# Patient Record
Sex: Female | Born: 1960 | Race: White | Hispanic: No | Smoking: Never smoker
Health system: Southern US, Community
[De-identification: ages and names within clinical notes are randomized; demographics above are authoritative.]

## PROBLEM LIST (undated history)

## (undated) DIAGNOSIS — K219 Gastro-esophageal reflux disease without esophagitis: Secondary | ICD-10-CM

## (undated) DIAGNOSIS — M722 Plantar fascial fibromatosis: Secondary | ICD-10-CM

## (undated) DIAGNOSIS — N951 Menopausal and female climacteric states: Secondary | ICD-10-CM

## (undated) DIAGNOSIS — K644 Residual hemorrhoidal skin tags: Secondary | ICD-10-CM

## (undated) DIAGNOSIS — E7800 Pure hypercholesterolemia, unspecified: Secondary | ICD-10-CM

## (undated) DIAGNOSIS — K5090 Crohn's disease, unspecified, without complications: Secondary | ICD-10-CM

## (undated) DIAGNOSIS — I10 Essential (primary) hypertension: Secondary | ICD-10-CM

## (undated) HISTORY — DX: Pure hypercholesterolemia, unspecified: E78.00

## (undated) HISTORY — PX: KNEE SURGERY: SHX244

## (undated) HISTORY — DX: Residual hemorrhoidal skin tags: K64.4

## (undated) HISTORY — DX: Menopausal and female climacteric states: N95.1

## (undated) HISTORY — PX: COLON SURGERY: SHX602

## (undated) HISTORY — DX: Plantar fascial fibromatosis: M72.2

---

## 2004-09-22 ENCOUNTER — Ambulatory Visit: Payer: Self-pay

## 2005-12-31 ENCOUNTER — Ambulatory Visit: Payer: Self-pay

## 2007-01-01 ENCOUNTER — Ambulatory Visit: Payer: Self-pay

## 2008-01-05 ENCOUNTER — Ambulatory Visit: Payer: Self-pay

## 2009-02-07 ENCOUNTER — Ambulatory Visit: Payer: Self-pay

## 2010-07-25 ENCOUNTER — Ambulatory Visit: Payer: Self-pay | Admitting: Urology

## 2010-08-09 ENCOUNTER — Ambulatory Visit: Payer: Self-pay | Admitting: Urology

## 2010-08-16 ENCOUNTER — Ambulatory Visit: Payer: Self-pay | Admitting: Urology

## 2010-09-19 ENCOUNTER — Ambulatory Visit: Payer: Self-pay

## 2011-12-24 ENCOUNTER — Ambulatory Visit: Payer: Self-pay

## 2012-01-02 ENCOUNTER — Ambulatory Visit: Payer: Self-pay

## 2013-12-03 HISTORY — PX: COLONOSCOPY: SHX174

## 2014-01-22 ENCOUNTER — Emergency Department: Payer: Self-pay | Admitting: Emergency Medicine

## 2014-01-23 LAB — URINALYSIS, COMPLETE
BILIRUBIN, UR: NEGATIVE
Glucose,UR: NEGATIVE mg/dL (ref 0–75)
Ketone: NEGATIVE
Nitrite: NEGATIVE
PH: 5 (ref 4.5–8.0)
Protein: NEGATIVE
RBC,UR: 18 /HPF (ref 0–5)
Specific Gravity: 1.029 (ref 1.003–1.030)
Squamous Epithelial: 1
WBC UR: 19 /HPF (ref 0–5)

## 2014-07-12 ENCOUNTER — Ambulatory Visit: Payer: Self-pay | Admitting: Gastroenterology

## 2017-02-14 ENCOUNTER — Encounter: Payer: Self-pay | Admitting: Certified Nurse Midwife

## 2017-02-14 ENCOUNTER — Ambulatory Visit (INDEPENDENT_AMBULATORY_CARE_PROVIDER_SITE_OTHER): Payer: BLUE CROSS/BLUE SHIELD | Admitting: Certified Nurse Midwife

## 2017-02-14 VITALS — BP 134/84 | HR 77 | Ht 64.0 in | Wt 176.0 lb

## 2017-02-14 DIAGNOSIS — N951 Menopausal and female climacteric states: Secondary | ICD-10-CM

## 2017-02-14 DIAGNOSIS — Z124 Encounter for screening for malignant neoplasm of cervix: Secondary | ICD-10-CM

## 2017-02-14 DIAGNOSIS — Z01419 Encounter for gynecological examination (general) (routine) without abnormal findings: Secondary | ICD-10-CM

## 2017-02-14 DIAGNOSIS — E7800 Pure hypercholesterolemia, unspecified: Secondary | ICD-10-CM | POA: Insufficient documentation

## 2017-02-14 MED ORDER — ESTRADIOL 1 MG PO TABS: 1.0000 mg | ORAL_TABLET | Freq: Every day | ORAL | 11 refills | Status: DC

## 2017-02-14 MED ORDER — NORETHINDRONE ACETATE 5 MG PO TABS: 2.5000 mg | ORAL_TABLET | Freq: Every day | ORAL | 11 refills | Status: DC

## 2017-02-14 NOTE — Progress Notes (Signed)
Gynecology Annual Exam  PCP: Maryland Pink, MD  Chief Complaint:  Chief Complaint  Patient presents with  . Gynecologic Exam  . Refill of hormone therapy  Needs refill of HT  History of Present Illness: Patient is a 56 y.o. G0 presents for annual exam. The patient has no complaints today. Her vasomotor symptoms are controlled on her hormone therapy. She has had no vaginal bleeding.  Since her last annual 02/09/2017, she has had no significant changes in her health. She has gained 12# since she sold her . Her husband has had a recurrence of his colon cancer. He had surgery for a bowel blockage and a PET scah shows that the cancer has metastasized to his liver and lung. He is receiving chemotherapy and she is his caregiver.    The patient is sexually active. She has no complaints of vaginal dryness.  last pap: on 02/10/2016 was NIL., last mammogram: 03/19/2016 was negative and she had a colonoscopy in 2015 that was negative. Her next colonoscopy is due in 2025.  The patient has regular exercise: no. She does get adequate calcium in her diet. She does not drink or use tobacco products. Review of Systems: ROS  Past Medical History:  Past Medical History:  Diagnosis Date  . Hypercholesteremia   . Vasomotor symptoms due to menopause     Past Surgical History:  Past Surgical History:  Procedure Laterality Date  . COLONOSCOPY  2015   normal, was advised to return in 10 years    Gynecologic History:  No history of abnormal Pap smears  Obstetric History: G0P0000  Family History:  Family History  Problem Relation Age of Onset  . Diabetes Mother   . Hypertension Mother   . Hyperlipidemia Mother   . Prostate cancer Mother   . Hypertension Father   . Hyperlipidemia Father   . Dementia Father     Social History:  Social History   Social History  . Marital status: Married    Spouse name: N/A  . Number of children: N/A  . Years of education: N/A    Occupational History  . Not on file.   Social History Main Topics  . Smoking status: Never Smoker  . Smokeless tobacco: Never Used  . Alcohol use No  . Drug use: No  . Sexual activity: Yes    Birth control/ protection: None   Other Topics Concern  . Not on file   Social History Narrative  . No narrative on file    Allergies:  No Known Allergies  Medications: Prior to Admission medications   Medication Sig Start Date End Date Taking? Authorizing Provider  Ascorbic Acid (VITAMIN C) 1000 MG tablet Take by mouth.   Yes Historical Provider, MD  Cholecalciferol (VITAMIN D3) 1000 units CAPS Take by mouth.   Yes Historical Provider, MD  Cyanocobalamin (VITAMIN B-12) 2500 MCG SUBL Take by mouth.   Yes Historical Provider, MD  estradiol (ESTRACE) 1 MG tablet Take 1 mg by mouth daily. 01/24/17  Yes Historical Provider, MD  norethindrone (AYGESTIN) 5 MG tablet Take 2.5 mg by mouth daily. 01/08/17  Yes Historical Provider, MD  simvastatin (ZOCOR) 20 MG tablet Take by mouth. 01/23/17  Yes Historical Provider, MD    Physical Exam Vitals: Blood pressure 134/84, pulse 77, height 5\' 4"  (1.626 m), weight 176 lb (79.8 kg).  General: NAD HEENT: normocephalic, anicteric Thyroid: no enlargement, no palpable nodules Pulmonary: No increased work of breathing, CTAB Cardiovascular: RRR without  murmur Breast: Breast symmetrical, no tenderness, no palpable nodules or masses, no skin or nipple retraction present, no nipple discharge.  No axillary or supraclavicular lymphadenopathy. Abdomen soft, non-tender, non-distended.  Umbilicus without lesions.  No hepatomegaly. No masses palpable. No evidence of hernia  Genitourinary:  External: Normal external female genitalia.  Normal urethral meatus, normal  Bartholin's and Skene's glands.    Vagina: Normal vaginal mucosa, no evidence of prolapse.    Cervix: stenotic, no bleeding  Uterus:AV, NSSC, mobile, NT  Adnexa:  no adnexal masses, NT  Rectal:  deferred  Lymphatic: no evidence of inguinal lymphadenopathy Extremities: no edema, erythema, or tenderness Neurologic: Grossly intact Psychiatric: mood appropriate, affect full         Assessment: 56 yo menopausal female with normal well woman exam. Plan: Problem List Items Addressed This Visit    Vasomotor symptoms due to menopause   Relevant Medications   estradiol (ESTRACE) 1 MG tablet   norethindrone (AYGESTIN) 5 MG tablet    Other Visit Diagnoses    Encounter for gynecological examination (general) (routine) without abnormal findings    -  Primary   Relevant Orders   IGP, Aptima HPV   Screening for cervical cancer       Relevant Orders   IGP, Aptima HPV      1) Mammogram  - recommend yearly screening mammogram - patient to schedule screening mammogram at [facility]   2) Pap smear - ASCCP guidelines and rational discussed.  Patient opts for every 3 year screening interval - Pap done today  3) Osteoporosis prevention-HT and adequate calcium and vitamin D intake  4) Routine healthcare maintenance including cholesterol, diabetes screening  Per her PCP Dr Kary Kos  5) Colonoscopy -UTD  6) Follow up 1 year for routine annual  Dalia Heading, [place]

## 2017-02-16 LAB — IGP, APTIMA HPV
HPV Aptima: NEGATIVE
PAP Smear Comment: 0

## 2017-02-19 ENCOUNTER — Other Ambulatory Visit: Payer: Self-pay | Admitting: Certified Nurse Midwife

## 2017-02-19 DIAGNOSIS — Z1231 Encounter for screening mammogram for malignant neoplasm of breast: Secondary | ICD-10-CM

## 2017-04-19 ENCOUNTER — Ambulatory Visit
Admission: RE | Admit: 2017-04-19 | Discharge: 2017-04-19 | Disposition: A | Discharge status: Home / Self Care | Payer: BLUE CROSS/BLUE SHIELD | Source: Ambulatory Visit | Attending: Certified Nurse Midwife | Admitting: Certified Nurse Midwife

## 2017-04-19 DIAGNOSIS — Z1231 Encounter for screening mammogram for malignant neoplasm of breast: Secondary | ICD-10-CM | POA: Insufficient documentation

## 2018-03-04 ENCOUNTER — Other Ambulatory Visit: Payer: Self-pay | Admitting: Certified Nurse Midwife

## 2018-03-04 DIAGNOSIS — N951 Menopausal and female climacteric states: Secondary | ICD-10-CM

## 2018-03-06 ENCOUNTER — Telehealth: Payer: Self-pay | Admitting: Certified Nurse Midwife

## 2018-03-06 DIAGNOSIS — N951 Menopausal and female climacteric states: Secondary | ICD-10-CM

## 2018-03-06 NOTE — Telephone Encounter (Signed)
Did she say which medication she needed?

## 2018-03-06 NOTE — Telephone Encounter (Signed)
PT has ean scheduled on 4/26 and only has 2 days left on her meds. Please send in ASAP so [facility] on [address]

## 2018-03-07 ENCOUNTER — Other Ambulatory Visit: Payer: Self-pay | Admitting: Certified Nurse Midwife

## 2018-03-07 DIAGNOSIS — N951 Menopausal and female climacteric states: Secondary | ICD-10-CM

## 2018-03-07 MED ORDER — NORETHINDRONE ACETATE 5 MG PO TABS: 2.5000 mg | ORAL_TABLET | Freq: Every day | ORAL | 0 refills | Status: DC

## 2018-03-07 NOTE — Telephone Encounter (Signed)
norethindrone (AYGESTIN) 5 MG tablet

## 2018-03-07 NOTE — Telephone Encounter (Signed)
rx sent with zero refills.

## 2018-03-10 ENCOUNTER — Telehealth: Payer: Self-pay

## 2018-03-10 NOTE — Telephone Encounter (Signed)
Spoke w/Yuha verbal given of Norethindrone 5 mg #15 w/0 rf.

## 2018-03-24 ENCOUNTER — Other Ambulatory Visit: Payer: Self-pay | Admitting: Certified Nurse Midwife

## 2018-03-24 DIAGNOSIS — Z1231 Encounter for screening mammogram for malignant neoplasm of breast: Secondary | ICD-10-CM

## 2018-03-28 ENCOUNTER — Encounter: Payer: Self-pay | Admitting: Certified Nurse Midwife

## 2018-03-28 ENCOUNTER — Ambulatory Visit (INDEPENDENT_AMBULATORY_CARE_PROVIDER_SITE_OTHER): Payer: BLUE CROSS/BLUE SHIELD | Admitting: Certified Nurse Midwife

## 2018-03-28 VITALS — BP 138/78 | HR 88 | Ht 64.0 in | Wt 176.0 lb

## 2018-03-28 DIAGNOSIS — N951 Menopausal and female climacteric states: Secondary | ICD-10-CM | POA: Diagnosis not present

## 2018-03-28 DIAGNOSIS — Z0000 Encounter for general adult medical examination without abnormal findings: Secondary | ICD-10-CM | POA: Diagnosis not present

## 2018-03-28 DIAGNOSIS — Z01419 Encounter for gynecological examination (general) (routine) without abnormal findings: Secondary | ICD-10-CM

## 2018-03-28 DIAGNOSIS — Z124 Encounter for screening for malignant neoplasm of cervix: Secondary | ICD-10-CM

## 2018-03-28 MED ORDER — ESTRADIOL 1 MG PO TABS: 1.0000 mg | ORAL_TABLET | Freq: Every day | ORAL | 3 refills | Status: DC

## 2018-03-28 MED ORDER — NORETHINDRONE ACETATE 5 MG PO TABS: 2.5000 mg | ORAL_TABLET | Freq: Every day | ORAL | 3 refills | Status: DC

## 2018-03-28 NOTE — Progress Notes (Signed)
Gynecology Annual Exam  PCP: Maryland Pink, MD  Chief Complaint:  Chief Complaint  Patient presents with  . Gynecologic Exam  Needs refill of HT  History of Present Illness: Patient is a 57 y.o. G13 postmenopausal female who presents for her annual exam. The patient has no complaints today. Her vasomotor symptoms are controlled on her hormone therapy. She has had no vaginal bleeding.  Since her last annual 02/14/2017, Conley lost her husband to metastatic colon cancer in February of this year. She is taking a year to get everything in order. 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   Her past medical history is remarkable for hyperlipidemia The patient is not currently sexually active.   Last pap: on 02/14/2017 was NIL/neg., last mammogram: 04/19/2017 was negative and she had a colonoscopy in 2015 that was negative. Her next colonoscopy is due in 2025.  The patient has regular exercise: no. She does get adequate calcium in her diet. She does not drink or use tobacco products. Review of Systems: Review of Systems  Constitutional: Negative for chills, fever and weight loss.  HENT: Negative for congestion, sinus pain and sore throat.   Eyes: Negative for blurred vision and pain.  Respiratory: Negative for hemoptysis, shortness of breath and wheezing.   Cardiovascular: Negative for chest pain, palpitations and leg swelling.  Gastrointestinal: Negative for abdominal pain, blood in stool, diarrhea, heartburn, nausea and vomiting.  Genitourinary: Negative for dysuria, frequency, hematuria and urgency.  Musculoskeletal: Negative for back pain, joint pain and myalgias.  Skin: Negative for itching and rash.  Neurological: Negative for dizziness, tingling and headaches.  Endo/Heme/Allergies: Negative for environmental allergies and polydipsia. Does not bruise/bleed easily.       Negative for hirsutism   Psychiatric/Behavioral: Negative for depression. The patient is not  nervous/anxious and does not have insomnia.     Past Medical History:  Past Medical History:  Diagnosis Date  . Hypercholesteremia   . Vasomotor symptoms due to menopause     Past Surgical History:  Past Surgical History:  Procedure Laterality Date  . COLONOSCOPY  2015   normal, was advised to return in 10 years    Gynecologic History:  No history of abnormal Pap smears  Obstetric History: G0P0000  Family History:  Family History  Problem Relation Age of Onset  . Diabetes Mother   . Hypertension Mother   . Hyperlipidemia Mother   . Hypertension Father   . Hyperlipidemia Father   . Dementia Father   . Prostate cancer Father   . Heart disease Father   . Breast cancer Neg Hx     Social History:  Social History   Socioeconomic History  . Marital status: Married    Spouse name: Not on file  . Number of children: 0  . Years of education: 22  . Highest education level: Not on file  Occupational History  . Not on file  Social Needs  . Financial resource strain: Not on file  . Food insecurity:    Worry: Not on file    Inability: Not on file  . Transportation needs:    Medical: Not on file    Non-medical: Not on file  Tobacco Use  . Smoking status: Never Smoker  . Smokeless tobacco: Never Used  Substance and Sexual Activity  . Alcohol use: No  . Drug use: No  . Sexual activity: Not Currently    Birth control/protection: None  Lifestyle  .  Physical activity:    Days per week: 0 days    Minutes per session: 0 min  . Stress: Not at all  Relationships  . Social connections:    Talks on phone: Not on file    Gets together: Not on file    Attends religious service: Not on file    Active member of club or organization: Not on file    Attends meetings of clubs or organizations: Not on file    Relationship status: Not on file  . Intimate partner violence:    Fear of current or ex partner: Not on file    Emotionally abused: Not on file    Physically abused:  Not on file    Forced sexual activity: Not on file  Other Topics Concern  . Not on file  Social History Narrative  . Not on file    Allergies:  No Known Allergies  Medications: Prior to Admission medications   Medication Sig Start Date End Date Taking? Authorizing Provider  Ascorbic Acid (VITAMIN C) 1000 MG tablet Take by mouth.   Yes Historical Provider, MD  Cholecalciferol (VITAMIN D3) 1000 units CAPS Take by mouth.   Yes Historical Provider, MD  Cyanocobalamin (VITAMIN B-12) 2500 MCG SUBL Take by mouth.   Yes Historical Provider, MD  estradiol (ESTRACE) 1 MG tablet Take 1 mg by mouth daily. 01/24/17  Yes Historical Provider, MD  norethindrone (AYGESTIN) 5 MG tablet Take 2.5 mg by mouth daily. 01/08/17  Yes Historical Provider, MD  simvastatin (ZOCOR) 20 MG tablet Take by mouth. 01/23/17  Yes Historical Provider, MD    Physical Exam Vitals: BP 138/78   Pulse 88   Ht 5\' 4"  (1.626 m)   Wt 176 lb (79.8 kg)   BMI 30.21 kg/m   General: WF in NAD HEENT: normocephalic, anicteric Thyroid: no enlargement, no palpable nodules Pulmonary: No increased work of breathing, CTAB Cardiovascular: RRR without murmur Breast: Breast symmetrical, no tenderness, no palpable nodules or masses, no skin or nipple retraction present, no nipple discharge.  No axillary or supraclavicular lymphadenopathy. Abdomen soft, non-tender, non-distended.  Umbilicus without lesions.  No hepatomegaly. No masses palpable. No evidence of hernia  Genitourinary:  External: Normal external female genitalia.  Normal urethral meatus, normal Bartholin's and Skene's glands.    Vagina: Normal vaginal mucosa, no evidence of prolapse.    Cervix: stenotic, no bleeding  Uterus:AV, NSSC, mobile, NT  Adnexa:  no adnexal masses, NT  Rectal: deferred  Lymphatic: no evidence of inguinal lymphadenopathy Extremities: no edema, erythema, or tenderness Neurologic: Grossly intact Psychiatric: mood appropriate, affect  full    Assessment: 57 yo menopausal female with normal well woman exam. Plan:1) Mammogram  - recommend yearly screening mammogram - patient has scheduled screening mammogram at [facility] for next month  2) Pap smear - ASCCP guidelines and rational discussed.   - Pap done today. Patient opts for every 3 year screening interval  3) Osteoporosis prevention-HT and adequate calcium and vitamin D intake  Refills of Estrace 1 mgm and Norethindrone 2.5 mgm daily sent to RX 4) Routine healthcare maintenance including cholesterol, diabetes screening  Per her PCP Dr Kary Kos  5) Colonoscopy -UTD  6) Follow up 1 year for routine annual  Dalia Heading, [place]

## 2018-03-29 ENCOUNTER — Encounter: Payer: Self-pay | Admitting: Certified Nurse Midwife

## 2018-04-01 LAB — IGP, APTIMA HPV
HPV Aptima: NEGATIVE
PAP Smear Comment: 0

## 2018-04-30 ENCOUNTER — Other Ambulatory Visit: Payer: Self-pay | Admitting: Certified Nurse Midwife

## 2018-04-30 ENCOUNTER — Ambulatory Visit
Admission: RE | Admit: 2018-04-30 | Discharge: 2018-04-30 | Disposition: A | Discharge status: Home / Self Care | Payer: BLUE CROSS/BLUE SHIELD | Source: Ambulatory Visit | Attending: Certified Nurse Midwife | Admitting: Certified Nurse Midwife

## 2018-04-30 DIAGNOSIS — Z1231 Encounter for screening mammogram for malignant neoplasm of breast: Secondary | ICD-10-CM

## 2019-01-14 ENCOUNTER — Other Ambulatory Visit: Payer: Self-pay | Admitting: Family Medicine

## 2019-01-14 ENCOUNTER — Other Ambulatory Visit: Payer: Self-pay | Admitting: Certified Nurse Midwife

## 2019-01-14 DIAGNOSIS — Z1231 Encounter for screening mammogram for malignant neoplasm of breast: Secondary | ICD-10-CM

## 2019-03-16 ENCOUNTER — Other Ambulatory Visit: Payer: Self-pay | Admitting: Certified Nurse Midwife

## 2019-03-16 DIAGNOSIS — N951 Menopausal and female climacteric states: Secondary | ICD-10-CM

## 2019-03-30 ENCOUNTER — Other Ambulatory Visit: Payer: Self-pay | Admitting: Certified Nurse Midwife

## 2019-03-30 DIAGNOSIS — N951 Menopausal and female climacteric states: Secondary | ICD-10-CM

## 2019-03-31 ENCOUNTER — Ambulatory Visit: Payer: BLUE CROSS/BLUE SHIELD | Admitting: Certified Nurse Midwife

## 2019-05-27 ENCOUNTER — Ambulatory Visit: Payer: Self-pay | Admitting: Urology

## 2019-06-01 ENCOUNTER — Encounter: Payer: Self-pay | Admitting: Certified Nurse Midwife

## 2019-06-01 ENCOUNTER — Other Ambulatory Visit: Payer: Self-pay

## 2019-06-01 ENCOUNTER — Ambulatory Visit (INDEPENDENT_AMBULATORY_CARE_PROVIDER_SITE_OTHER): Payer: BC Managed Care – PPO | Admitting: Certified Nurse Midwife

## 2019-06-01 VITALS — BP 150/90 | HR 90 | Ht 64.0 in | Wt 173.0 lb

## 2019-06-01 DIAGNOSIS — Z79890 Hormone replacement therapy (postmenopausal): Secondary | ICD-10-CM

## 2019-06-01 DIAGNOSIS — Z01419 Encounter for gynecological examination (general) (routine) without abnormal findings: Secondary | ICD-10-CM | POA: Diagnosis not present

## 2019-06-01 DIAGNOSIS — Z1239 Encounter for other screening for malignant neoplasm of breast: Secondary | ICD-10-CM

## 2019-06-01 DIAGNOSIS — R030 Elevated blood-pressure reading, without diagnosis of hypertension: Secondary | ICD-10-CM

## 2019-06-01 DIAGNOSIS — N951 Menopausal and female climacteric states: Secondary | ICD-10-CM

## 2019-06-01 MED ORDER — CLOTRIMAZOLE-BETAMETHASONE 1-0.05 % EX CREA: 1.0000 "application " | TOPICAL_CREAM | Freq: Two times a day (BID) | CUTANEOUS | 0 refills | Status: DC

## 2019-06-01 MED ORDER — FLUCONAZOLE 150 MG PO TABS: 150.0000 mg | ORAL_TABLET | Freq: Once | ORAL | 0 refills | Status: AC

## 2019-06-01 MED ORDER — NORETHINDRONE ACETATE 5 MG PO TABS: 2.5000 mg | ORAL_TABLET | Freq: Every day | ORAL | 3 refills | Status: DC

## 2019-06-01 MED ORDER — ESTRADIOL 1 MG PO TABS: 1.0000 mg | ORAL_TABLET | Freq: Every day | ORAL | 3 refills | Status: DC

## 2019-06-01 NOTE — Progress Notes (Signed)
Gynecology Annual Exam  PCP: Maryland Pink, MD  Chief Complaint:  Chief Complaint  Patient presents with  . Gynecologic Exam    No complaints  Needs refill of HT  History of Present Illness: Patient is a 58 y.o. G21 postmenopausal female who presents for her annual exam. The patient complains of some mild intermittent vulvar itching recently. 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 At a recent physical, she had microscopic hematuria and has an appointment tomorrow with a urologist. Her vasomotor symptoms are controlled on her hormone therapy. She had an episode of postcoital bleeding when first resuming intercourse after a long period of abstinence.  Since her last annual 03/28/2018, Marshal has had no significant changes in her health.  Her past medical history is remarkable for hyperlipidemia   Last pap: on 03/28/2018 was NIL/ negative.  Last mammogram: 04/30/2018 was negative. There is no family history of breast or ovarian cancer. She had a colonoscopy in 2015 that was negative. Her next colonoscopy is due in 2025.   The patient has regular exercise: no, but has a physical job.  She does get adequate calcium in her diet and takes vitamin D3 supplements. She does not drink or use tobacco products. Her last lipid panel was 10/16/2018.    Review of Systems: Review of Systems  Constitutional: Negative for chills, fever and weight loss.  HENT: Negative for congestion, sinus pain and sore throat.   Eyes: Negative for blurred vision and pain.  Respiratory: Negative for hemoptysis, shortness of breath and wheezing.   Cardiovascular: Negative for chest pain, palpitations and leg swelling.  Gastrointestinal: Negative for abdominal pain, blood in stool, diarrhea, heartburn, nausea and vomiting.  Genitourinary: Negative for dysuria, frequency, hematuria and urgency.  Musculoskeletal: Negative for back  pain, joint pain and myalgias.  Skin: Negative for itching and rash.  Neurological: Negative for dizziness, tingling and headaches.  Endo/Heme/Allergies: Negative for environmental allergies and polydipsia. Does not bruise/bleed easily.       Negative for hirsutism   Psychiatric/Behavioral: Negative for depression. The patient is not nervous/anxious and does not have insomnia.     Past Medical History:  Past Medical History:  Diagnosis Date  . Hypercholesteremia   . Vasomotor symptoms due to menopause     Past Surgical History:  Past Surgical History:  Procedure Laterality Date  . COLONOSCOPY  2015   normal, was advised to return in 10 years    Gynecologic History:  No history of abnormal Pap smears  Obstetric History: G0P0000  Family History:  Family History  Problem Relation Age of Onset  . Diabetes Mother   . Hypertension Mother   . Hyperlipidemia Mother   . Hypertension Father   . Hyperlipidemia Father   . Dementia Father   . Prostate cancer Father   . Heart disease Father   . Breast cancer Neg Hx     Social History:  Social History   Socioeconomic History  . Marital status: Widowed    Spouse name: Not on file  . Number of children: 0  . Years of education: 83  . Highest education level: Not on file  Occupational History  . Not on file  Social Needs  . Financial resource strain: Not on file  . Food insecurity    Worry: Not on file    Inability: Not on file  . Transportation needs    Medical: Not  on file    Non-medical: Not on file  Tobacco Use  . Smoking status: Never Smoker  . Smokeless tobacco: Never Used  Substance and Sexual Activity  . Alcohol use: No  . Drug use: No  . Sexual activity: Not Currently    Birth control/protection: None  Lifestyle  . Physical activity    Days per week: 0 days    Minutes per session: 0 min  . Stress: Not at all  Relationships  . Social Herbalist on phone: Not on file    Gets together: Not on  file    Attends religious service: Not on file    Active member of club or organization: Not on file    Attends meetings of clubs or organizations: Not on file    Relationship status: Not on file  . Intimate partner violence    Fear of current or ex partner: Not on file    Emotionally abused: Not on file    Physically abused: Not on file    Forced sexual activity: Not on file  Other Topics Concern  . Not on file  Social History Narrative  . Not on file    Allergies:  No Known Allergies  Medications: Prior to Admission medications   Medication Sig Start Date End Date Taking? Authorizing Provider  Ascorbic Acid (VITAMIN C) 1000 MG tablet Take by mouth.   Yes Historical Provider, MD  Cholecalciferol (VITAMIN D3) 1000 units CAPS Take by mouth.   Yes Historical Provider, MD  Cyanocobalamin (VITAMIN B-12) 2500 MCG SUBL Take by mouth.   Yes Historical Provider, MD  estradiol (ESTRACE) 1 MG tablet Take 1 mg by mouth daily. 01/24/17  Yes Historical Provider, MD  norethindrone (AYGESTIN) 5 MG tablet Take 2.5 mg by mouth daily. 01/08/17  Yes Historical Provider, MD  simvastatin (ZOCOR) 20 MG tablet Take by mouth. 01/23/17  Yes Historical Provider, MD    Physical Exam Vitals: BP (!) 150/90 (BP Location: Right Arm, Patient Position: Sitting, Cuff Size: Normal)   Pulse 90   Ht 5\' 4"  (1.626 m)   Wt 173 lb (78.5 kg)   BMI 29.70 kg/m   General: WF in NAD HEENT: normocephalic, anicteric Thyroid: no enlargement, no palpable nodules Pulmonary: No increased work of breathing, CTAB Cardiovascular: RRR without murmur Breast: Breast symmetrical, no tenderness, no palpable nodules or masses, no skin or nipple retraction present, no nipple discharge.  No axillary or supraclavicular lymphadenopathy. Abdomen soft, non-tender, non-distended.  Umbilicus without lesions.  No hepatomegaly. No masses palpable. No evidence of hernia  Genitourinary:  External: Normal external female genitalia.  Normal  urethral meatus, normal Bartholin's and Skene's glands.    Vagina: Normal vaginal mucosa, no evidence of prolapse.    Cervix: stenotic, no bleeding  Uterus:AV, NSSC, mobile, NT  Adnexa:  no adnexal masses, NT  Rectal: deferred  Lymphatic: no evidence of inguinal lymphadenopathy Extremities: no edema, erythema, or tenderness Neurologic: Grossly intact Psychiatric: mood appropriate, affect full    Assessment: 58 yo menopausal female with normal well woman exam. Hormone therapy Elevated blood pressure (was rushing to get here from work)  Plan:1) Mammogram  - recommend yearly screening mammogram - patient has scheduled screening mammogram at [facility] for next month  2) Pap smear - ASCCP guidelines and rational discussed.   - Pap not done today. Patient opts for every 3 year screening interval  3) Osteoporosis prevention-HT and adequate calcium and vitamin D intake  Refills of Estrace 1 mgm and  Norethindrone 2.5 mgm daily sent to RX  4) Routine healthcare maintenance including cholesterol, diabetes screening  Per her PCP Dr Kary Kos. Has appointment tomorrow at urologist-will have blood pressure repeated there. Last BP at PCP was 130/80  5) Colonoscopy -UTD  6) Follow up 1 year for routine annual  Dalia Heading, [place]

## 2019-06-03 ENCOUNTER — Other Ambulatory Visit: Payer: Self-pay

## 2019-06-03 ENCOUNTER — Ambulatory Visit (INDEPENDENT_AMBULATORY_CARE_PROVIDER_SITE_OTHER): Payer: BLUE CROSS/BLUE SHIELD | Admitting: Urology

## 2019-06-03 ENCOUNTER — Encounter: Payer: Self-pay | Admitting: Certified Nurse Midwife

## 2019-06-03 ENCOUNTER — Encounter: Payer: Self-pay | Admitting: Urology

## 2019-06-03 VITALS — BP 161/86 | HR 93 | Ht 64.0 in | Wt 173.0 lb

## 2019-06-03 DIAGNOSIS — R3129 Other microscopic hematuria: Secondary | ICD-10-CM

## 2019-06-03 LAB — MICROSCOPIC EXAMINATION

## 2019-06-03 LAB — URINALYSIS, COMPLETE
Bilirubin, UA: NEGATIVE
Glucose, UA: NEGATIVE
Ketones, UA: NEGATIVE
Leukocytes,UA: NEGATIVE
Nitrite, UA: NEGATIVE
Specific Gravity, UA: >1.03 — ABNORMAL HIGH (ref 1.005–1.030)
Urobilinogen, Ur: 0.2 mg/dL (ref 0.2–1.0)
pH, UA: 5 (ref 5.0–7.5)

## 2019-06-03 NOTE — Progress Notes (Signed)
06/03/2019 3:54 PM   Patricia Callahan 1961/01/08 [id number]  Referring provider: Maryland Pink, MD [address] [facility] [place],   [address]  Chief Complaint  Patient presents with  . Hematuria    New Patient    HPI: 57 year old female referred for further evaluation of microscopic hematuria.  She is been noted to have microscopic blood in her urine over the past several years with varying amounts.  This is persisted on her urine today, 3-10 red blood cells per high-power field.  Please see care everywhere for details.  She reports that she underwent a work-up for this back in 2011 with Dr. Bernardo Heater.  This included a CT scan for which the report is available which reveals no GU pathology.  She also underwent cystoscopy via rigid cystoscopy which was also per her report unremarkable.  She reports that she was never told that she had blood in her urine prior to menopause.  Since she has become postmenopausal, she is consistently had this in her urine.  She is a never smoker.  No history of kidney stones.  No flank pain.  No dysuria, UTIs, or gross hematuria.   PMH: Past Medical History:  Diagnosis Date  . Hypercholesteremia   . Vasomotor symptoms due to menopause     Surgical History: Past Surgical History:  Procedure Laterality Date  . COLONOSCOPY  2015   normal, was advised to return in 10 years    Home Medications:  Allergies as of 06/03/2019   No Known Allergies     Medication List       Accurate as of June 03, 2019  3:54 PM. If you have any questions, ask your nurse or doctor.        STOP taking these medications   clotrimazole-betamethasone cream Commonly known as: Lotrisone Stopped by: Hollice Espy, MD     TAKE these medications   estradiol 1 MG tablet Commonly known as: ESTRACE Take 1 tablet (1 mg total) by mouth daily.   multivitamin capsule Take by mouth.   norethindrone 5 MG tablet Commonly known as: AYGESTIN Take 0.5  tablets (2.5 mg total) by mouth daily.   simvastatin 20 MG tablet Commonly known as: ZOCOR Take by mouth.   Vitamin B-12 2500 MCG Subl Take by mouth.   vitamin C 1000 MG tablet Take by mouth.   Vitamin D3 25 MCG (1000 UT) Caps Take by mouth.       Allergies: No Known Allergies  Family History: Family History  Problem Relation Age of Onset  . Diabetes Mother   . Hypertension Mother   . Hyperlipidemia Mother   . Hypertension Father   . Hyperlipidemia Father   . Dementia Father   . Prostate cancer Father   . Heart disease Father   . Breast cancer Neg Hx     Social History:  reports that she has never smoked. She has never used smokeless tobacco. She reports that she does not drink alcohol or use drugs.  ROS: UROLOGY Frequent Urination?: No Hard to postpone urination?: No Burning/pain with urination?: No Get up at night to urinate?: Yes Leakage of urine?: No Urine stream starts and stops?: No Trouble starting stream?: No Do you have to strain to urinate?: No Blood in urine?: Yes Urinary tract infection?: No Sexually transmitted disease?: No Injury to kidneys or bladder?: No Painful intercourse?: No Weak stream?: No Currently pregnant?: No Vaginal bleeding?: No Last menstrual period?: n  Gastrointestinal Nausea?: No Vomiting?: No  Indigestion/heartburn?: No Diarrhea?: No Constipation?: No  Constitutional Fever: No Night sweats?: No Weight loss?: No Fatigue?: No  Skin Skin rash/lesions?: No Itching?: No  Eyes Blurred vision?: No Double vision?: No  Ears/Nose/Throat Sore throat?: No Sinus problems?: No  Hematologic/Lymphatic Swollen glands?: No Easy bruising?: No  Cardiovascular Leg swelling?: No Chest pain?: No  Respiratory Cough?: No Shortness of breath?: No  Endocrine Excessive thirst?: No  Musculoskeletal Back pain?: No Joint pain?: No  Neurological Headaches?: No Dizziness?: No  Psychologic Depression?: No  Anxiety?: No  Physical Exam: BP (!) 161/86   Pulse 93   Ht 5\' 4"  (1.626 m)   Wt 173 lb (78.5 kg)   BMI 29.70 kg/m   Constitutional:  Alert and oriented, No acute distress. HEENT: [facility] AT, moist mucus membranes.  Trachea midline, no masses. Cardiovascular: No clubbing, cyanosis, or edema. Respiratory: Normal respiratory effort, no increased work of breathing. Skin: No rashes, bruises or suspicious lesions. Neurologic: Grossly intact, no focal deficits, moving all 4 extremities. Psychiatric: Normal mood and affect.  Laboratory Data: Cr 1.0 11/19  Urinalysis UA today 3-10 RBC/ HPF  Pertinent Imaging: N/a  Assessment & Plan:    1. Microscopic hematuria We discussed the differential diagnosis for microscopic hematuria including nephrolithiasis, renal or upper tract tumors, bladder stones, UTIs, or bladder tumors as well as undetermined etiologies.  Per AUA guidelines, I did recommend complete microscopic hematuria evaluation including CTU, possible urine cytology, and office cystoscopy.  And that is been since 2011 since her last work-up, based on new guidelines, another work-up is warranted.  We discussed today that we no longer use rigid cystoscopy and thus flexible cystoscopy is very well-tolerated in the office.  She is agreeable to try this.  - Urinalysis, Complete - CT HEMATURIA WORKUP; Future   Return in about 4 weeks (around 07/01/2019) for CT scan, cysto.  Hollice Espy, MD  [facility] [address], [place], [place] [address] [phone]

## 2019-06-15 ENCOUNTER — Ambulatory Visit
Admission: RE | Admit: 2019-06-15 | Discharge: 2019-06-15 | Disposition: A | Discharge status: Home / Self Care | Payer: BC Managed Care – PPO | Source: Ambulatory Visit | Attending: Family Medicine | Admitting: Family Medicine

## 2019-06-15 ENCOUNTER — Other Ambulatory Visit: Payer: Self-pay

## 2019-06-15 DIAGNOSIS — Z1231 Encounter for screening mammogram for malignant neoplasm of breast: Secondary | ICD-10-CM | POA: Insufficient documentation

## 2019-07-09 ENCOUNTER — Other Ambulatory Visit: Payer: Self-pay | Admitting: Certified Nurse Midwife

## 2019-07-16 ENCOUNTER — Other Ambulatory Visit: Payer: Self-pay

## 2019-07-16 ENCOUNTER — Ambulatory Visit
Admission: RE | Admit: 2019-07-16 | Discharge: 2019-07-16 | Disposition: A | Discharge status: Home / Self Care | Payer: BC Managed Care – PPO | Source: Ambulatory Visit | Attending: Urology | Admitting: Urology

## 2019-07-16 DIAGNOSIS — R3129 Other microscopic hematuria: Secondary | ICD-10-CM

## 2019-07-16 MED ORDER — IOHEXOL 300 MG/ML  SOLN
125.0000 mL | Freq: Once | INTRAMUSCULAR | Status: AC | PRN
  Administered 2019-07-16: 125 mL via INTRAVENOUS

## 2019-07-21 ENCOUNTER — Ambulatory Visit (INDEPENDENT_AMBULATORY_CARE_PROVIDER_SITE_OTHER): Payer: BC Managed Care – PPO | Admitting: Urology

## 2019-07-21 ENCOUNTER — Other Ambulatory Visit: Payer: Self-pay

## 2019-07-21 VITALS — BP 170/91 | HR 82 | Ht 64.0 in | Wt 173.0 lb

## 2019-07-21 DIAGNOSIS — R3129 Other microscopic hematuria: Secondary | ICD-10-CM

## 2019-07-21 LAB — MICROSCOPIC EXAMINATION: RBC: NONE SEEN /hpf (ref 0–2)

## 2019-07-21 LAB — URINALYSIS, COMPLETE
Bilirubin, UA: NEGATIVE
Glucose, UA: NEGATIVE
Leukocytes,UA: NEGATIVE
Nitrite, UA: NEGATIVE
Specific Gravity, UA: 1.025 (ref 1.005–1.030)
Urobilinogen, Ur: 0.2 mg/dL (ref 0.2–1.0)
pH, UA: 5 (ref 5.0–7.5)

## 2019-07-21 NOTE — Progress Notes (Signed)
   07/21/19  CC:  Chief Complaint  Patient presents with  . Cysto    HPI: 58 yo F with microscopic hematuria presenting for cysto.  Please see previous notes for detail.    CT hematuria 07/16/19 unremarkable other than possible IBD, schedule for GU eval.  NED. A&Ox3.   No respiratory distress   Abd soft, NT, ND Normal external genitalia with patent urethral meatus  Cystoscopy Procedure Note  Patient identification was confirmed, informed consent was obtained, and patient was prepped using Betadine solution.  Lidocaine jelly was administered per urethral meatus.    Procedure: - Flexible cystoscope introduced, without any difficulty.   - Thorough search of the bladder revealed:    normal urethral meatus    normal urothelium    no stones    no ulcers     no tumors    no urethral polyps    no trabeculation  - Ureteral orifices were normal in position and appearance.  Post-Procedure: - Patient tolerated the procedure well  Assessment/ Plan:  1. Microscopic hematuria S/p negative work up including cysto today and CT urogram f/u with GI for incidental findings - Urinalysis, Complete   Hollice Espy, MD

## 2019-07-28 NOTE — Telephone Encounter (Signed)
Left detailed msg on Pharm vm.

## 2019-07-29 ENCOUNTER — Telehealth: Payer: Self-pay | Admitting: Urology

## 2019-07-29 DIAGNOSIS — R933 Abnormal findings on diagnostic imaging of other parts of digestive tract: Secondary | ICD-10-CM

## 2019-07-29 NOTE — Telephone Encounter (Signed)
Ok to refer to GI

## 2019-07-29 NOTE — Telephone Encounter (Signed)
Sure  Order placed

## 2019-07-29 NOTE — Telephone Encounter (Signed)
Patient is calling and asking for [place] to refer her to a GI doctor because of something that was found on her CT scan that dr. Erlene Quan ordered. She said that when she was given her results she was told that if she wanted [place] to we could refer her?   Patricia Callahan

## 2019-07-30 ENCOUNTER — Ambulatory Visit (INDEPENDENT_AMBULATORY_CARE_PROVIDER_SITE_OTHER): Payer: BC Managed Care – PPO | Admitting: Gastroenterology

## 2019-07-30 ENCOUNTER — Other Ambulatory Visit: Payer: Self-pay

## 2019-07-30 ENCOUNTER — Encounter: Payer: Self-pay | Admitting: Gastroenterology

## 2019-07-30 DIAGNOSIS — R933 Abnormal findings on diagnostic imaging of other parts of digestive tract: Secondary | ICD-10-CM

## 2019-07-30 NOTE — Patient Instructions (Signed)
CTE is scheduled for 08/20/19 at 11:00. Please arrive to medical Mall at 9:30 to prep for the procedure. Nothing to eat or drink after 7am

## 2019-07-31 NOTE — Progress Notes (Signed)
Patricia Callahan [address]  [place]  [place], [place] [address]  Main: [phone]  Fax: [phone]   Gastroenterology Consultation  Referring Provider:     Hollice Espy, MD Primary Care Physician:  [place] Pink, MD Reason for Consultation:    Abnormal CT        HPI:    Chief Complaint  Patient presents with   CT result that is abdnormal    Patricia Callahan is a 58 y.o. y/o female referred for consultation & management  by Dr. Maryland Pink, MD.  Patient was seeing urology for hematuria and CT showed abnormal appearance of small bowel loops, and patient was referred to [place]. The patient denies abdominal or flank pain, anorexia, nausea or vomiting, dysphagia, change in bowel habits, diarrhea, or black or bloody stools or weight loss.  Patient has had a previous colonoscopy in 2015, see probation for results that was normal except for hemorrhoids and repeat recommended in 10 years.  No family history of colon cancer.  Past Medical History:  Diagnosis Date   Hypercholesteremia    Vasomotor symptoms due to menopause     Past Surgical History:  Procedure Laterality Date   COLONOSCOPY  2015   normal, was advised to return in 10 years    Prior to Admission medications   Medication Sig Start Date End Date Taking? Authorizing Provider  Ascorbic Acid (VITAMIN C) 1000 MG tablet Take by mouth.   Yes [provider]  Cholecalciferol (VITAMIN D3) 1000 units CAPS Take by mouth.   Yes [provider]  Cyanocobalamin (VITAMIN B-12) 2500 MCG SUBL Take by mouth.   Yes [provider]  diphenhydramine-acetaminophen (TYLENOL PM EXTRA STRENGTH) 25-500 MG TABS tablet Take 1 tablet by mouth at bedtime as needed. Takes every night   Yes [provider]  estradiol (ESTRACE) 1 MG tablet Take 1 tablet (1 mg total) by mouth daily. 06/01/19  Yes Dalia Heading, CNM  Multiple Vitamin (MULTIVITAMIN) capsule Take by mouth.   Yes [provider]  norethindrone (AYGESTIN) 5 MG tablet Take 0.5 tablets (2.5 mg total) by mouth daily. 06/01/19  Yes Dalia Heading, CNM  simvastatin (ZOCOR) 20 MG tablet Take by mouth. 01/23/17  Yes [provider]  clobetasol cream (TEMOVATE) 0.05 %  07/28/19   [provider]  nystatin cream (MYCOSTATIN)  07/24/19   [provider]  triamcinolone cream (KENALOG) 0.1 %  07/24/19   [provider]    Family History  Problem Relation Age of Onset   Diabetes Mother    Hypertension Mother    Hyperlipidemia Mother    Hypertension Father    Hyperlipidemia Father    Dementia Father    Prostate cancer Father    Heart disease Father    Breast cancer Neg Hx      Social History   Tobacco Use   Smoking status: Never Smoker   Smokeless tobacco: Never Used  Substance Use Topics   Alcohol use: No   Drug use: No    Allergies as of 07/30/2019   (No Known Allergies)    Review of Systems:    All systems reviewed and negative except where noted in HPI.   Physical Exam:  BP (!) 149/87 (BP Location: Left Arm, Patient Position: Sitting, Cuff Size: Normal)    Pulse 80    Temp 98.2 F (36.8 C) (Oral)    Wt 172 lb (78 kg)    BMI 29.52 kg/m  No LMP  recorded. Patient is postmenopausal. Psych:  Alert and cooperative. Normal mood and affect. General:   Alert,  Well-developed, well-nourished, pleasant and cooperative in NAD Head:  Normocephalic and atraumatic. Eyes:  Sclera clear, no icterus.   Conjunctiva pink. Ears:  Normal auditory acuity. Nose:  No deformity, discharge, or lesions. Mouth:  No deformity or lesions,oropharynx pink & moist. Neck:  Supple; no masses or thyromegaly. Abdomen:  Normal bowel sounds.  No bruits.  Soft, non-tender and non-distended without masses, hepatosplenomegaly or hernias noted.  No guarding or rebound tenderness.    Msk:  Symmetrical without gross deformities. Good, equal movement & strength bilaterally. Pulses:   Normal pulses noted. Extremities:  No clubbing or edema.  No cyanosis. Neurologic:  Alert and oriented x3;  grossly normal neurologically. Skin:  Intact without significant lesions or rashes. No jaundice. Lymph Nodes:  No significant cervical adenopathy. Psych:  Alert and cooperative. Normal mood and affect.   Labs: CBC No results found for: WBC, RBC, HGB, HCT, PLT, MCV, MCH, MCHC, RDW, LYMPHSABS, MONOABS, EOSABS, BASOSABS CMP  No results found for: NA, K, CL, CO2, GLUCOSE, BUN, CREATININE, CALCIUM, PROT, ALBUMIN, AST, ALT, ALKPHOS, BILITOT, GFRNONAA, GFRAA  Imaging Studies: Ct Hematuria Workup  Result Date: 07/16/2019 CLINICAL DATA:  Microscopic hematuria. EXAM: CT ABDOMEN AND PELVIS WITHOUT AND WITH CONTRAST TECHNIQUE: Multidetector CT imaging of the abdomen and pelvis was performed following the standard protocol before and following the bolus administration of intravenous contrast. CONTRAST:  181mL OMNIPAQUE IOHEXOL 300 MG/ML  SOLN COMPARISON:  07/25/2010 FINDINGS: Lower chest: No acute abnormality. Hepatobiliary: No focal liver abnormality is seen. No gallstones, gallbladder wall thickening, or biliary dilatation. Pancreas: Unremarkable. No pancreatic ductal dilatation or surrounding inflammatory changes. Spleen: Normal in size without focal abnormality. Adrenals/Urinary Tract: Normal appearance of the adrenal glands. No kidney stones identified bilaterally. No ureteral lithiasis. No kidney mass or hydronephrosis. On the delayed images there is no suspicious filling defects identified within the collecting systems or ureters. The bladder appears unremarkable. Stomach/Bowel: Stomach appears normal. There is an abnormal appearance of the small bowel. Several short segments of bowel wall thickening and mucosal enhancement are identified with intervening areas of normal small bowel. The terminal ileum appears increased in caliber. There is abnormal, asymmetric wall thickening of the terminal ileum  the wall thickening of the terminal ileum measures up to 11 mm, image 49/13. No abnormal dilatation of the colon. Vascular/Lymphatic: Aortic atherosclerosis. No aneurysm. No enlarged abdominopelvic lymph nodes. Reproductive: Uterus and bilateral adnexa are unremarkable. Other: No free fluid or fluid collections identified. No findings to suggest acute inflammation. Musculoskeletal: No acute or significant osseous findings. IMPRESSION: 1. No urinary tract calculi or focal urinary tract lesion identified to explain patient's hematuria. 2. Abnormal appearance of the small bowel loops including the terminal ileum. Findings are suggestive of chronic sequelae of inflammatory bowel disease, Crohn's disease not excluded. Consider GI consultation for further workup. 3.  Aortic Atherosclerosis (ICD10-I70.0). Electronically Signed   By: Kerby Moors M.D.   On: 07/16/2019 09:54    Assessment and Plan:   Sarissa L Bensch is a 58 y.o. y/o female has been referred for abnormal CT as above  Patient does not have any clinical symptoms to attribute to the abnormal small bowel appearance on the CT scan  This may have been a passing infection or nonspecific findings  She does not have any clinical symptoms to attribute to IBD  We discussed several options including repeating colonoscopy with terminal ileum intubation for biopsies.  Patient would like to avoid this option and is agreeable to a CTE instead and if that is abnormal, she is willing to proceed with a colonoscopy at that time  Await CTE results  Patient will inform us if she develops any symptoms of concern  Dr Patricia Callahan  Speech recognition software was used to dictate the above note.

## 2019-08-14 ENCOUNTER — Telehealth: Payer: Self-pay | Admitting: Gastroenterology

## 2019-08-14 NOTE — Telephone Encounter (Signed)
Patient called l/m on v/mshe needs to cancel CT Scan because insurance denied. They stayed to a scope 1st. Please call patient & let her now ct cancelled & what the next step will be. [phone].

## 2019-08-14 NOTE — Telephone Encounter (Signed)
I am trying to get this approved by a appeal. I sent more information to the insurance department on 08/13/2019 and they state they will have a decision by the first of next week.

## 2019-08-14 NOTE — Telephone Encounter (Signed)
Called and left a detail message for patient stating we are doing a appeal on the medication and should have a decision by the first of next week

## 2019-08-14 NOTE — Telephone Encounter (Signed)
Patient called back and she verbalized understanding. States just to give her a call back when we hear something

## 2019-08-17 NOTE — Telephone Encounter (Signed)
Called the appeals department and patients scan was approved from 08/17/2019 to 01/26/2020. Approval number is [id number]. Called patient and left patient a detail message letting patient know the scan was approved and that she could go to have the scan done on 08/20/2019 and arrived at 9:30

## 2019-08-20 ENCOUNTER — Other Ambulatory Visit: Payer: Self-pay

## 2019-08-20 ENCOUNTER — Ambulatory Visit
Admission: RE | Admit: 2019-08-20 | Discharge: 2019-08-20 | Disposition: A | Discharge status: Home / Self Care | Payer: BC Managed Care – PPO | Source: Ambulatory Visit | Attending: Gastroenterology | Admitting: Gastroenterology

## 2019-08-20 DIAGNOSIS — R933 Abnormal findings on diagnostic imaging of other parts of digestive tract: Secondary | ICD-10-CM | POA: Insufficient documentation

## 2019-08-20 MED ORDER — IOHEXOL 300 MG/ML  SOLN
100.0000 mL | Freq: Once | INTRAMUSCULAR | Status: AC | PRN
  Administered 2019-08-20: 100 mL via INTRAVENOUS

## 2019-08-21 NOTE — Addendum Note (Signed)
Addended by: [name] on: 08/21/2019 12:39 PM   Modules accepted: Orders

## 2019-08-24 ENCOUNTER — Other Ambulatory Visit: Payer: Self-pay

## 2019-08-24 ENCOUNTER — Telehealth: Payer: Self-pay

## 2019-08-24 DIAGNOSIS — R933 Abnormal findings on diagnostic imaging of other parts of digestive tract: Secondary | ICD-10-CM

## 2019-08-24 NOTE — Telephone Encounter (Signed)
-----   Message from Virgel Manifold, MD sent at 08/21/2019 12:38 PM EDT ----- Caryl Pina please let the patient know, the CT continues to show enteritis or inflammation in the small bowel suspicious for Crohns disease. I have ordered blood work to evaluate this further. She will likely need a colonoscopy after the blood work. Please move up her appt with me to as soon as she can make it, ok to overbook

## 2019-08-24 NOTE — Telephone Encounter (Signed)
Patient verbalized understanding of lab results of the CT. Patient moved her appointment to October 19th.Patient will come get lab work today.

## 2019-08-25 ENCOUNTER — Other Ambulatory Visit: Payer: Self-pay

## 2019-08-25 ENCOUNTER — Telehealth: Payer: Self-pay

## 2019-08-25 DIAGNOSIS — K529 Noninfective gastroenteritis and colitis, unspecified: Secondary | ICD-10-CM

## 2019-08-25 DIAGNOSIS — Z1211 Encounter for screening for malignant neoplasm of colon: Secondary | ICD-10-CM

## 2019-08-25 LAB — CBC

## 2019-08-25 LAB — COMPREHENSIVE METABOLIC PANEL
ALT: 14 IU/L (ref 0–32)
AST: 18 IU/L (ref 0–40)
Albumin/Globulin Ratio: 1.4 (ref 1.2–2.2)
Albumin: 4.1 g/dL (ref 3.8–4.9)
Alkaline Phosphatase: 59 IU/L (ref 39–117)
BUN/Creatinine Ratio: 11 (ref 9–23)
BUN: 10 mg/dL (ref 6–24)
Bilirubin Total: 0.4 mg/dL (ref 0.0–1.2)
CO2: 22 mmol/L (ref 20–29)
Calcium: 9.9 mg/dL (ref 8.7–10.2)
Chloride: 101 mmol/L (ref 96–106)
Creatinine, Ser: 0.88 mg/dL (ref 0.57–1.00)
GFR calc Af Amer: 84 mL/min/{1.73_m2} (ref >59)
GFR calc non Af Amer: 73 mL/min/{1.73_m2} (ref >59)
Globulin, Total: 2.9 g/dL (ref 1.5–4.5)
Glucose: 86 mg/dL (ref 65–99)
Potassium: 4.2 mmol/L (ref 3.5–5.2)
Sodium: 137 mmol/L (ref 134–144)
Total Protein: 7 g/dL (ref 6.0–8.5)

## 2019-08-25 LAB — SEDIMENTATION RATE

## 2019-08-25 LAB — C-REACTIVE PROTEIN: CRP: 7 mg/L (ref 0–10)

## 2019-08-25 MED ORDER — NA SULFATE-K SULFATE-MG SULF 17.5-3.13-1.6 GM/177ML PO SOLN: 354.0000 mL | Freq: Once | ORAL | 0 refills | Status: AC

## 2019-08-25 NOTE — Telephone Encounter (Signed)
-----   Message from Virgel Manifold, MD sent at 08/25/2019 10:41 AM EDT ----- Patricia Callahan please let the patient know, her inflammatory marker was normal.  However, due to the persistent findings on her recent scan, I would recommend a colonoscopy, for enteritis, for further evaluation.  Please schedule with me or any other providers available within the next few weeks if she agrees.

## 2019-08-25 NOTE — Telephone Encounter (Signed)
When patient came to get her labs on 08/24/2019 patient refused to do the stool test. She stated she was not going to have these done

## 2019-08-25 NOTE — Telephone Encounter (Signed)
Patient verbalized understanding of results. Patient agreed to colonoscopy. Scheduled for 09/10/2019 with Dr. Vicente Males. Informed patient covid test would be on 09/07/2019. Mailed patient instructions and sent prep to pharmacy

## 2019-09-07 ENCOUNTER — Other Ambulatory Visit
Admission: RE | Admit: 2019-09-07 | Discharge: 2019-09-07 | Disposition: A | Discharge status: Home / Self Care | Payer: BC Managed Care – PPO | Source: Ambulatory Visit | Attending: Gastroenterology | Admitting: Gastroenterology

## 2019-09-07 DIAGNOSIS — Z01818 Encounter for other preprocedural examination: Secondary | ICD-10-CM | POA: Insufficient documentation

## 2019-09-07 DIAGNOSIS — Z20828 Contact with and (suspected) exposure to other viral communicable diseases: Secondary | ICD-10-CM | POA: Diagnosis not present

## 2019-09-07 LAB — SARS CORONAVIRUS 2 (TAT 6-24 HRS): SARS Coronavirus 2: NEGATIVE

## 2019-09-10 ENCOUNTER — Ambulatory Visit
Admission: RE | Admit: 2019-09-10 | Discharge: 2019-09-10 | Disposition: A | Discharge status: Home / Self Care | Payer: BC Managed Care – PPO | Attending: Gastroenterology | Admitting: Gastroenterology

## 2019-09-10 ENCOUNTER — Ambulatory Visit: Payer: BC Managed Care – PPO | Admitting: Certified Registered Nurse Anesthetist

## 2019-09-10 ENCOUNTER — Encounter
Admission: RE | Disposition: A | Discharge status: Home / Self Care | Payer: Self-pay | Source: Home / Self Care | Attending: Gastroenterology

## 2019-09-10 ENCOUNTER — Encounter: Payer: Self-pay | Admitting: *Deleted

## 2019-09-10 DIAGNOSIS — D125 Benign neoplasm of sigmoid colon: Secondary | ICD-10-CM | POA: Insufficient documentation

## 2019-09-10 DIAGNOSIS — Z79899 Other long term (current) drug therapy: Secondary | ICD-10-CM | POA: Insufficient documentation

## 2019-09-10 DIAGNOSIS — Z793 Long term (current) use of hormonal contraceptives: Secondary | ICD-10-CM | POA: Diagnosis not present

## 2019-09-10 DIAGNOSIS — R933 Abnormal findings on diagnostic imaging of other parts of digestive tract: Secondary | ICD-10-CM | POA: Diagnosis present

## 2019-09-10 DIAGNOSIS — K529 Noninfective gastroenteritis and colitis, unspecified: Secondary | ICD-10-CM

## 2019-09-10 DIAGNOSIS — Z1211 Encounter for screening for malignant neoplasm of colon: Secondary | ICD-10-CM

## 2019-09-10 DIAGNOSIS — Z79890 Hormone replacement therapy (postmenopausal): Secondary | ICD-10-CM | POA: Diagnosis not present

## 2019-09-10 DIAGNOSIS — K635 Polyp of colon: Secondary | ICD-10-CM | POA: Diagnosis not present

## 2019-09-10 DIAGNOSIS — R935 Abnormal findings on diagnostic imaging of other abdominal regions, including retroperitoneum: Secondary | ICD-10-CM

## 2019-09-10 DIAGNOSIS — E7800 Pure hypercholesterolemia, unspecified: Secondary | ICD-10-CM | POA: Insufficient documentation

## 2019-09-10 DIAGNOSIS — K56699 Other intestinal obstruction unspecified as to partial versus complete obstruction: Secondary | ICD-10-CM | POA: Insufficient documentation

## 2019-09-10 HISTORY — PX: COLONOSCOPY WITH PROPOFOL: SHX5780

## 2019-09-10 SURGERY — COLONOSCOPY WITH PROPOFOL
Anesthesia: General

## 2019-09-10 MED ORDER — LIDOCAINE HCL (CARDIAC) PF 100 MG/5ML IV SOSY
PREFILLED_SYRINGE | INTRAVENOUS | Status: DC | PRN
  Administered 2019-09-10: 50 mg via INTRAVENOUS

## 2019-09-10 MED ORDER — PROPOFOL 10 MG/ML IV BOLUS
INTRAVENOUS | Status: DC | PRN
  Administered 2019-09-10: 80 mg via INTRAVENOUS

## 2019-09-10 MED ORDER — PROPOFOL 500 MG/50ML IV EMUL
INTRAVENOUS | Status: AC
  Filled 2019-09-10: qty 50

## 2019-09-10 MED ORDER — MIDAZOLAM HCL 2 MG/2ML IJ SOLN
INTRAMUSCULAR | Status: DC | PRN
  Administered 2019-09-10: 2 mg via INTRAVENOUS

## 2019-09-10 MED ORDER — LIDOCAINE HCL (PF) 2 % IJ SOLN
INTRAMUSCULAR | Status: AC
  Filled 2019-09-10: qty 10

## 2019-09-10 MED ORDER — PROPOFOL 500 MG/50ML IV EMUL
INTRAVENOUS | Status: DC | PRN
  Administered 2019-09-10: 130 ug/kg/min via INTRAVENOUS

## 2019-09-10 MED ORDER — MIDAZOLAM HCL 2 MG/2ML IJ SOLN
INTRAMUSCULAR | Status: AC
  Filled 2019-09-10: qty 2

## 2019-09-10 MED ORDER — SODIUM CHLORIDE 0.9 % IV SOLN
INTRAVENOUS | Status: DC
  Administered 2019-09-10: 08:00:00 1000 mL via INTRAVENOUS

## 2019-09-10 NOTE — H&P (Signed)
Jonathon Bellows, MD [address] [place], [place], [place], [address] [address], [place], [place], [place], [address] Phone: [phone]  Fax: [phone]  Primary Care Physician:  Maryland Pink, MD   Pre-Procedure History & Physical: HPI:  Patricia Callahan is a 58 y.o. female is here for an colonoscopy.   Past Medical History:  Diagnosis Date  . Hypercholesteremia   . Vasomotor symptoms due to menopause     Past Surgical History:  Procedure Laterality Date  . COLONOSCOPY  2015   normal, was advised to return in 10 years    Prior to Admission medications   Medication Sig Start Date End Date Taking? Authorizing Provider  estradiol (ESTRACE) 1 MG tablet Take 1 tablet (1 mg total) by mouth daily. 06/01/19  Yes Dalia Heading, CNM  norethindrone (AYGESTIN) 5 MG tablet Take 0.5 tablets (2.5 mg total) by mouth daily. 06/01/19  Yes Dalia Heading, CNM  simvastatin (ZOCOR) 20 MG tablet Take by mouth. 01/23/17  Yes [provider]  Ascorbic Acid (VITAMIN C) 1000 MG tablet Take by mouth.    [provider]  Cholecalciferol (VITAMIN D3) 1000 units CAPS Take by mouth.    [provider]  clobetasol cream (TEMOVATE) 0.05 %  07/28/19   [provider]  Cyanocobalamin (VITAMIN B-12) 2500 MCG SUBL Take by mouth.    [provider]  diphenhydramine-acetaminophen (TYLENOL PM EXTRA STRENGTH) 25-500 MG TABS tablet Take 1 tablet by mouth at bedtime as needed. Takes every night    [provider]  Multiple Vitamin (MULTIVITAMIN) capsule Take by mouth.    [provider]  nystatin cream (MYCOSTATIN)  07/24/19   [provider]  triamcinolone cream (KENALOG) 0.1 %  07/24/19   [provider]    Allergies as of 08/25/2019  . (No Known Allergies)    Family History  Problem Relation Age of Onset  . Diabetes Mother   . Hypertension Mother   . Hyperlipidemia Mother   . Hypertension Father   .  Hyperlipidemia Father   . Dementia Father   . Prostate cancer Father   . Heart disease Father   . Breast cancer Neg Hx     Social History   Socioeconomic History  . Marital status: Widowed    Spouse name: Not on file  . Number of children: 0  . Years of education: 12  . Highest education level: Not on file  Occupational History  . Not on file  Social Needs  . Financial resource strain: Not on file  . Food insecurity    Worry: Not on file    Inability: Not on file  . Transportation needs    Medical: Not on file    Non-medical: Not on file  Tobacco Use  . Smoking status: Never Smoker  . Smokeless tobacco: Never Used  Substance and Sexual Activity  . Alcohol use: No  . Drug use: No  . Sexual activity: Not Currently    Birth control/protection: None  Lifestyle  . Physical activity    Days per week: 0 days    Minutes per session: 0 min  . Stress: Not at all  Relationships  . Social Herbalist on phone: Not on file    Gets together: Not on file    Attends religious service: Not on file    Active member of club or organization: Not on file    Attends meetings of clubs or organizations: Not on  file    Relationship status: Not on file  . Intimate partner violence    Fear of current or ex partner: Not on file    Emotionally abused: Not on file    Physically abused: Not on file    Forced sexual activity: Not on file  Other Topics Concern  . Not on file  Social History Narrative  . Not on file    Review of Systems: See HPI, otherwise negative ROS  Physical Exam: BP (!) 158/84   Pulse 88   Temp 97.7 F (36.5 C) (Tympanic)   Resp 18   Ht 5\' 4"  (1.626 m)   Wt 78.9 kg   SpO2 100%   BMI 29.87 kg/m  General:   Alert,  pleasant and cooperative in NAD Head:  Normocephalic and atraumatic. Neck:  Supple; no masses or thyromegaly. Lungs:  Clear throughout to auscultation, normal respiratory effort.    Heart:  +S1, +S2, Regular rate and rhythm, No edema.  Abdomen:  Soft, nontender and nondistended. Normal bowel sounds, without guarding, and without rebound.   Neurologic:  Alert and  oriented x4;  grossly normal neurologically.  Impression/Plan: Patricia Callahan is here for an colonoscopy to be performed for abnormal CT scan . Risks, benefits, limitations, and alternatives regarding  colonoscopy have been reviewed with the patient.  Questions have been answered.  All parties agreeable.   Jonathon Bellows, MD  09/10/2019, 8:14 AM

## 2019-09-10 NOTE — Anesthesia Post-op Follow-up Note (Signed)
Anesthesia QCDR form completed.        

## 2019-09-10 NOTE — Transfer of Care (Signed)
Immediate Anesthesia Transfer of Care Note  Patient: Patricia Callahan  Procedure(s) Performed: COLONOSCOPY WITH PROPOFOL (N/A )  Patient Location: PACU and Endoscopy Unit  Anesthesia Type:General  Level of Consciousness: drowsy  Airway & Oxygen Therapy: Patient Spontanous Breathing  Post-op Assessment: Report given to RN and Post -op Vital signs reviewed and stable  Post vital signs: Reviewed and stable  Last Vitals:  Vitals Value Taken Time  BP 137/75 09/10/19 0856  Temp    Pulse 89 09/10/19 0856  Resp 20 09/10/19 0856  SpO2 97 % 09/10/19 0856  Vitals shown include unvalidated device data.  Last Pain:  Vitals:   09/10/19 0800  TempSrc: Tympanic  PainSc: 0-No pain         Complications: No apparent anesthesia complications

## 2019-09-10 NOTE — Anesthesia Preprocedure Evaluation (Signed)
Anesthesia Evaluation  Patient identified by MRN, date of birth, ID band Patient awake    Reviewed: Allergy & Precautions, H&P , NPO status , Patient's Chart, lab work & pertinent test results, reviewed documented beta blocker date and time   Airway Mallampati: II   Neck ROM: full    Dental  (+) Poor Dentition   Pulmonary neg pulmonary ROS,    Pulmonary exam normal        Cardiovascular Exercise Tolerance: Good negative cardio ROS Normal cardiovascular exam Rhythm:regular Rate:Normal     Neuro/Psych negative neurological ROS  negative psych ROS   GI/Hepatic negative GI ROS, Neg liver ROS,   Endo/Other  negative endocrine ROS  Renal/GU negative Renal ROS  negative genitourinary   Musculoskeletal   Abdominal   Peds  Hematology negative hematology ROS (+)   Anesthesia Other Findings Past Medical History: No date: Hypercholesteremia No date: Vasomotor symptoms due to menopause Past Surgical History: 2015: COLONOSCOPY     Comment:  normal, was advised to return in 10 years BMI    Body Mass Index: 29.87 kg/m     Reproductive/Obstetrics negative OB ROS                             Anesthesia Physical Anesthesia Plan  ASA: II  Anesthesia Plan: General   Post-op Pain Management:    Induction:   PONV Risk Score and Plan:   Airway Management Planned:   Additional Equipment:   Intra-op Plan:   Post-operative Plan:   Informed Consent: I have reviewed the patients History and Physical, chart, labs and discussed the procedure including the risks, benefits and alternatives for the proposed anesthesia with the patient or authorized representative who has indicated his/her understanding and acceptance.     Dental Advisory Given  Plan Discussed with: CRNA  Anesthesia Plan Comments:         Anesthesia Quick Evaluation

## 2019-09-10 NOTE — Op Note (Signed)
[facility] Patient Name: Patricia Callahan Procedure Date: 09/10/2019 8:24 AM MRN: [id number] Account #: [id number] Date of Birth: November 05, 1961 Admit Type: Outpatient Age: 58 Room: [facility] ENDO ROOM 4 Gender: Female Note Status: Finalized Procedure:            Colonoscopy Indications:          Abnormal CT of the GI tract Providers:            Jonathon Bellows MD, MD Referring MD:         Irven Easterly. Kary Kos, MD (Referring MD) Medicines:            Monitored Anesthesia Care Complications:        No immediate complications. Procedure:            Pre-Anesthesia Assessment:                       - Prior to the procedure, a History and Physical was                        performed, and patient medications, allergies and                        sensitivities were reviewed. The patient's tolerance of                        previous anesthesia was reviewed.                       - The risks and benefits of the procedure and the                        sedation options and risks were discussed with the                        patient. All questions were answered and informed                        consent was obtained.                       - ASA Grade Assessment: II - A patient with mild                        systemic disease.                       After obtaining informed consent, the colonoscope was                        passed under direct vision. Throughout the procedure,                        the patient's blood pressure, pulse, and oxygen                        saturations were monitored continuously. The                        Colonoscope was introduced through the anus and  advanced to the the cecum, identified by the                        appendiceal orifice. The colonoscopy was performed with                        ease. The patient tolerated the procedure well. The                        quality of the bowel preparation was  excellent. Findings:      The perianal and digital rectal examinations were normal.      A 5 mm polyp was found in the sigmoid colon. The polyp was sessile. The       polyp was removed with a cold biopsy forceps. Resection and retrieval       were complete.      A benign-appearing, intrinsic severe stenosis measuring 6 mm (inner       diameter) was found at the ileocecal valve and was non-traversed. spent       over 10 mins trying to intubate IC valve was not possible- hard to       maintain scope to obtainm any biopsies      The exam was otherwise without abnormality on direct and retroflexion       views. Impression:           - One 5 mm polyp in the sigmoid colon, removed with a                        cold biopsy forceps. Resected and retrieved.                       - Stricture at the ileocecal valve.                       - The examination was otherwise normal on direct and                        retroflexion views. Recommendation:       - Discharge patient to home (with escort).                       - Resume previous diet.                       - Continue present medications.                       - Await pathology results.                       - Repeat colonoscopy in 5 years for surveillance.                       - F/u with Dr Bonna Gains                       2. Options for next step is to repeat MR enterogram in                        2-3 months to see if acute inflammation if present may  have resolvced and check for stenosis vs referral to                        surgery to resect distal part of the TI, IC valve and                        if it is localized crohns can also be a good modality                        of treatment Procedure Code(s):    --- Professional ---                       [phone], Colonoscopy, flexible; with biopsy, single or                        multiple Diagnosis Code(s):    --- Professional ---                       K63.5, Polyp  of colon                       K56.699, Other intestinal obstruction unspecified as to                        partial versus complete obstruction                       R93.3, Abnormal findings on diagnostic imaging of other                        parts of digestive tract CPT copyright 2019 [employer]. All rights reserved. The codes documented in this report are preliminary and upon coder review may  be revised to meet current compliance requirements. Jonathon Bellows, MD Jonathon Bellows MD, MD 09/10/2019 8:55:48 AM This report has been signed electronically. Number of Addenda: 0 Note Initiated On: 09/10/2019 8:24 AM Scope Withdrawal Time: 0 hours 17 minutes 0 seconds  Total Procedure Duration: 0 hours 22 minutes 30 seconds  Estimated Blood Loss: Estimated blood loss: none.      [facility]

## 2019-09-11 ENCOUNTER — Encounter: Payer: Self-pay | Admitting: Gastroenterology

## 2019-09-11 LAB — SURGICAL PATHOLOGY

## 2019-09-14 ENCOUNTER — Encounter: Payer: Self-pay | Admitting: Gastroenterology

## 2019-09-16 NOTE — Anesthesia Postprocedure Evaluation (Signed)
Anesthesia Post Note  Patient: Patricia Callahan  Procedure(s) Performed: COLONOSCOPY WITH PROPOFOL (N/A )  Patient location during evaluation: PACU Anesthesia Type: General Level of consciousness: awake and alert Pain management: pain level controlled Vital Signs Assessment: post-procedure vital signs reviewed and stable Respiratory status: spontaneous breathing, nonlabored ventilation, respiratory function stable and patient connected to nasal cannula oxygen Cardiovascular status: blood pressure returned to baseline and stable Postop Assessment: no apparent nausea or vomiting Anesthetic complications: no     Last Vitals:  Vitals:   09/10/19 0916 09/10/19 0926  BP: 138/69 136/90  Pulse: 84   Resp: 16   Temp:    SpO2: 100%     Last Pain:  Vitals:   09/10/19 0916  TempSrc:   PainSc: 0-No pain                 Molli Barrows

## 2019-09-21 ENCOUNTER — Ambulatory Visit (INDEPENDENT_AMBULATORY_CARE_PROVIDER_SITE_OTHER): Payer: BC Managed Care – PPO | Admitting: Gastroenterology

## 2019-09-21 ENCOUNTER — Other Ambulatory Visit: Payer: Self-pay

## 2019-09-21 ENCOUNTER — Encounter: Payer: Self-pay | Admitting: Gastroenterology

## 2019-09-21 VITALS — BP 172/89 | HR 90 | Temp 98.6°F | Wt 176.4 lb

## 2019-09-21 DIAGNOSIS — K56699 Other intestinal obstruction unspecified as to partial versus complete obstruction: Secondary | ICD-10-CM

## 2019-09-21 DIAGNOSIS — R933 Abnormal findings on diagnostic imaging of other parts of digestive tract: Secondary | ICD-10-CM

## 2019-09-21 NOTE — Progress Notes (Signed)
[name], MD [address]  [place]  [place], [place] [address]  Main: [phone]  Fax: [phone]   Primary Care Physician: Maryland Pink, MD  CC: Follow-up of ileocecal valve stricture  HPI: Patricia Callahan is a 58 y.o. female previously seen for abnormal CT showing abnormal appearance of small bowel loops without any symptoms.  On initial visit patient chose to undergo CTE instead of colonoscopy.  CTE showed no significant change in mild to moderate enteritis involving the distal ileum and terminal ileum "highly suspicious for Crohn's disease".  CRP was normal, ESR was ordered but canceled by lab.  Patient subsequently underwent colonoscopy with Dr. Vicente Males showed ileocecal valve stricture and it could not be intubated.  5 mm colon polyp was also seen.  Polyp showed tubular adenoma.  The patient denies abdominal or flank pain, anorexia, nausea or vomiting, dysphagia, change in bowel habits or black or bloody stools or weight loss.  Does report intermittent abdominal bloating and that is her only GI symptom.  Does report that she used to take Advil PM daily for 2 years and she stopped this since her CT findings.  Patient has had a previous colonoscopy in 2015, see probation for results that was normal except for hemorrhoids and repeat recommended in 10 years.  Current Outpatient Medications  Medication Sig Dispense Refill  . Ascorbic Acid (VITAMIN C) 1000 MG tablet Take by mouth.    . Cholecalciferol (VITAMIN D3) 1000 units CAPS Take by mouth.    . Cyanocobalamin (VITAMIN B-12) 2500 MCG SUBL Take by mouth.    . diphenhydramine-acetaminophen (TYLENOL PM EXTRA STRENGTH) 25-500 MG TABS tablet Take 1 tablet by mouth at bedtime as needed. Takes every night    . estradiol (ESTRACE) 1 MG tablet Take 1 tablet (1 mg total) by mouth daily. 90 tablet 3  . Multiple Vitamin (MULTIVITAMIN) capsule Take by mouth.    . norethindrone (AYGESTIN) 5 MG tablet Take 0.5 tablets (2.5 mg  total) by mouth daily. 45 tablet 3  . simvastatin (ZOCOR) 20 MG tablet Take by mouth.    . clobetasol cream (TEMOVATE) 0.05 %     . nystatin cream (MYCOSTATIN)     . triamcinolone cream (KENALOG) 0.1 %      No current facility-administered medications for this visit.     Allergies as of 09/21/2019  . (No Known Allergies)    ROS:  General: Negative for anorexia, weight loss, fever, chills, fatigue, weakness. ENT: Negative for hoarseness, difficulty swallowing , nasal congestion. CV: Negative for chest pain, angina, palpitations, dyspnea on exertion, peripheral edema.  Respiratory: Negative for dyspnea at rest, dyspnea on exertion, cough, sputum, wheezing.  GI: See history of present illness. GU:  Negative for dysuria, hematuria, urinary incontinence, urinary frequency, nocturnal urination.  Endo: Negative for unusual weight change.    Physical Examination:  Vitals:   09/21/19 1516 09/21/19 1517  BP: (!) 173/114 (!) 172/89  Pulse: 90   Temp: 98.6 F (37 C)   TempSrc: Oral   Weight: 176 lb 6 oz (80 kg)      General: Well-nourished, well-developed in no acute distress.  Eyes: No icterus. Conjunctivae pink. Mouth: Oropharyngeal mucosa moist and pink , no lesions erythema or exudate. Neck: Supple, Trachea midline Abdomen: Bowel sounds are normal, nontender, nondistended, no hepatosplenomegaly or masses, no abdominal bruits or hernia , no rebound or guarding.   Extremities: No lower extremity edema. No clubbing or deformities. Neuro: Alert and oriented x 3.  Grossly intact. Skin: Warm and dry, no jaundice.   Psych: Alert and cooperative, normal mood and affect.   Labs: CMP     Component Value Date/Time   NA 137 08/24/2019 1334   K 4.2 08/24/2019 1334   CL 101 08/24/2019 1334   CO2 22 08/24/2019 1334   GLUCOSE 86 08/24/2019 1334   BUN 10 08/24/2019 1334   CREATININE 0.88 08/24/2019 1334   CALCIUM 9.9 08/24/2019 1334   PROT 7.0 08/24/2019 1334   ALBUMIN 4.1  08/24/2019 1334   AST 18 08/24/2019 1334   ALT 14 08/24/2019 1334   ALKPHOS 59 08/24/2019 1334   BILITOT 0.4 08/24/2019 1334   GFRNONAA 73 08/24/2019 1334   GFRAA 84 08/24/2019 1334   Lab Results  Component Value Date   WBC CANCELED 08/24/2019   HGB CANCELED 08/24/2019   HCT CANCELED 08/24/2019   PLT CANCELED 08/24/2019    Imaging Studies: No results found.  Assessment and Plan:   Patricia Callahan is a 58 y.o. y/o female with imaging showing enteritis involving the terminal ileum, with colonoscopy showing ileocecal valve stricture, with biopsies unable to be obtained due to the stricture  Patient does not have any classic symptoms of IBD However, the size of the stricture, being very narrow is concerning  Given that she does not have any classic IBD symptoms, question if the stricture is due to ulceration from NSAIDs at that site  Either way, her first CT scan done on August 13 and then her second CTE done on November 17 did not show any improvement, and therefore this may not improve without surgery  Patient has an appointment next week already with Dr. Lysle Pearl and she was referred to them by her PCP for evaluation of external hemorrhoids.  She has her colonoscopy report and I have asked her to discuss colonoscopy findings and evaluation for surgery for the ileocecal valve stricture with the surgeon as well.  She verbalized understanding.  This note will be sent to them as well.  Surgical resection of that area would be both diagnostic and therapeutic  With normal inflammatory markers and no other signs or symptoms of Crohn's disease, will not start on any Crohn's medications at this time as diagnosis is unclear.  MRE in 2 months is also another option if Dr. Lysle Pearl does not recommend surgery.  Dr [name]

## 2019-09-30 ENCOUNTER — Ambulatory Visit: Payer: Self-pay | Admitting: Surgery

## 2019-09-30 NOTE — H&P (Signed)
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 female who was referred by [facility] for evaluation of above. First noted 2 months ago on CT workup for hematuria.  Asymptomatic, patient denies any pain, N/V, melena,hematochizia  Also hear today for her hemorrhoids which cause issues with keeping clean after BMs.  Otherwise asymptomatic as above.    Past Medical History:  has a past medical history of Chickenpox, Constipation, Hemorrhoids, and Hyperlipidemia.  Past Surgical History:  has a past surgical history that includes Colonoscopy (07/12/2014) and Tdap (06/2011).  Family History: family history includes Allergies in her brother; Diabetes type II in her mother; Myocardial Infarction (Heart attack) in her father; Prostate cancer in her father.  Social History:  reports that she has never smoked. She has never used smokeless tobacco. She reports that she does not drink alcohol or use drugs.  Current Medications: has a current medication list which includes the following prescription(s): ascorbic acid (vitamin c), cholecalciferol, cyanocobalamin (vitamin b-12), estradiol, multivitamin, norethindrone, prednisolone, simvastatin, metronidazole, and neomycin, and the following Facility-Administered Medications: cyanocobalamin.  Allergies:  No Known Allergies  ROS:  A 15 point review of systems was performed and pertinent positives and negatives noted in HPI   Objective:     BP 140/78   Pulse 85   Ht 162.6 cm (5\' 4" )   Wt 78 kg (172 lb)   BMI 29.52 kg/m   Constitutional :  alert, appears stated age, cooperative and no distress  Lymphatics/Throat:  no asymmetry, masses, or scars  Respiratory:  clear to auscultation bilaterally  Cardiovascular:  regular rate and rhythm  Gastrointestinal: soft, non-tender; bowel sounds normal; no masses,  no organomegaly.    Musculoskeletal: Steady gait and movement  Skin: Cool and moist   Psychiatric: Normal affect, non-agitated, not confused  Rectal: Chaperone present for exam.  External exam with large skin tags in anterior/posterior location.  DRE revealed internal hemorrhoids. Non-tender, no discharge or gross blood noted.    LABS:  n/a  RADS: CLINICAL DATA: Follow-up enteritis seen on recent CT.  EXAM: CT ABDOMEN AND PELVIS WITH CONTRAST (ENTEROGRAPHY)  TECHNIQUE: Multidetector CT of the abdomen and pelvis during bolus administration of intravenous contrast. Negative oral contrast was given.  CONTRAST: 139mL OMNIPAQUE IOHEXOL 300 MG/ML SOLN  COMPARISON: 07/16/2019  FINDINGS: Lower Chest: No acute findings.  Hepatobiliary: No hepatic masses identified. Gallbladder is unremarkable. No evidence of biliary ductal dilatation.  Pancreas: No mass or inflammatory changes.  Spleen: Within normal limits in size and appearance.  Adrenals/Urinary Tract: No masses identified. No evidence of hydronephrosis.  Stomach/Bowel: Moderate wall thickening and mucosal hyper-enhancement are again seen involving the terminal ileum. There is also mild wall thickening and hyperenhancement involving other loops of distal ileum the pelvis. These findings show no significant change compared to previous study. No evidence of bowel obstruction. No evidence of fistula or abscess. No evidence of colonic involvement. Normal appendix visualized.  Vascular/Lymphatic: No pathologically enlarged lymph nodes. No abdominal aortic aneurysm. Aortic atherosclerosis.  Reproductive: No mass or other significant abnormality.  Other: None.  Musculoskeletal: No suspicious bone lesions identified.  IMPRESSION: No significant change in mild-to-moderate enteritis involving the distal ileum and terminal ileum, highly suspicious for Crohn's disease.  No evidence of abscess, bowel obstruction, or other complication.   Electronically Signed  By: Marlaine Hind M.D.  On:  08/20/2019 13:34 CLINICAL DATA: Microscopic hematuria.  EXAM: CT ABDOMEN AND PELVIS WITHOUT AND WITH CONTRAST  TECHNIQUE:  Multidetector CT imaging of the abdomen and pelvis was performed following the standard protocol before and following the bolus administration of intravenous contrast.  CONTRAST: 157mL OMNIPAQUE IOHEXOL 300 MG/ML SOLN  COMPARISON: 07/25/2010  FINDINGS: Lower chest: No acute abnormality.  Hepatobiliary: No focal liver abnormality is seen. No gallstones, gallbladder wall thickening, or biliary dilatation.  Pancreas: Unremarkable. No pancreatic ductal dilatation or surrounding inflammatory changes.  Spleen: Normal in size without focal abnormality.  Adrenals/Urinary Tract: Normal appearance of the adrenal glands.  No kidney stones identified bilaterally. No ureteral lithiasis. No kidney mass or hydronephrosis. On the delayed images there is no suspicious filling defects identified within the collecting systems or ureters. The bladder appears unremarkable.  Stomach/Bowel: Stomach appears normal. There is an abnormal appearance of the small bowel. Several short segments of bowel wall thickening and mucosal enhancement are identified with intervening areas of normal small bowel. The terminal ileum appears increased in caliber. There is abnormal, asymmetric wall thickening of the terminal ileum the wall thickening of the terminal ileum measures up to 11 mm, image 49/13. No abnormal dilatation of the colon.  Vascular/Lymphatic: Aortic atherosclerosis. No aneurysm. No enlarged abdominopelvic lymph nodes.  Reproductive: Uterus and bilateral adnexa are unremarkable.  Other: No free fluid or fluid collections identified. No findings to suggest acute inflammation.  Musculoskeletal: No acute or significant osseous findings.  IMPRESSION: 1. No urinary tract calculi or focal urinary tract lesion identified to explain patient's hematuria. 2. Abnormal  appearance of the small bowel loops including the terminal ileum. Findings are suggestive of chronic sequelae of inflammatory bowel disease, Crohn's disease not excluded. Consider GI consultation for further workup. 3. Aortic Atherosclerosis (ICD10-I70.0).   Electronically Signed  By: Kerby Moors M.D.  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  Reviewed CT images along with the colonoscopy report personally.  I agree with the GI providers concern about this being Crohn's and a pending small bowel obstruction.  Remote chance of a neoplasm cannot be excluded either.  Therefore proceeding with surgical resection would likely be appropriate at this time.  We will proceed with hand-assisted laparoscopic ileocecectomy/right colectomy depending on what we find.  Discussed The risk oflaparoscopic colon resectionsurgery includes, but not limited to, recurrence, bleeding, chronic pain, post-op infxn, post-op SBO or ileus, hernias, resection of bowel, re-anastamosis, possible ostomy placement and need for re-operation to address said risks. The risks of general anesthetic, if used, includes MI, CVA, sudden death or even reaction to anesthetic medications also discussed. Alternatives include continued observation.Benefits include possible symptom relief, preventing further decline in health and possible death.  Typical post-op recovery time of additional days in hospital for observation afterwards also discussed.  Prep ordered. Will proceed with ERAS protocol as well. Pending medical clearance and workup as noted above.   The patientverbalized understanding and all questions were answered to the patient's satisfaction.  2) External anal skin tags and hemorrhoids.  Asymptomatic except for hygiene issues.  Has not tried a bidet to maintain better hygiene.  Due to  the more pressing issue of above, she would like to defer any surgical treatment at this time.  I stated to her that we can address the hemorrhoidal skin tags at any time.

## 2019-09-30 NOTE — H&P (View-Only) (Signed)
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 female who was referred by [facility] for evaluation of above. First noted 2 months ago on CT workup for hematuria.  Asymptomatic, patient denies any pain, N/V, melena,hematochizia  Also hear today for her hemorrhoids which cause issues with keeping clean after BMs.  Otherwise asymptomatic as above.    Past Medical History:  has a past medical history of Chickenpox, Constipation, Hemorrhoids, and Hyperlipidemia.  Past Surgical History:  has a past surgical history that includes Colonoscopy (07/12/2014) and Tdap (06/2011).  Family History: family history includes Allergies in her brother; Diabetes type II in her mother; Myocardial Infarction (Heart attack) in her father; Prostate cancer in her father.  Social History:  reports that she has never smoked. She has never used smokeless tobacco. She reports that she does not drink alcohol or use drugs.  Current Medications: has a current medication list which includes the following prescription(s): ascorbic acid (vitamin c), cholecalciferol, cyanocobalamin (vitamin b-12), estradiol, multivitamin, norethindrone, prednisolone, simvastatin, metronidazole, and neomycin, and the following Facility-Administered Medications: cyanocobalamin.  Allergies:  No Known Allergies  ROS:  A 15 point review of systems was performed and pertinent positives and negatives noted in HPI   Objective:     BP 140/78   Pulse 85   Ht 162.6 cm (5\' 4" )   Wt 78 kg (172 lb)   BMI 29.52 kg/m   Constitutional :  alert, appears stated age, cooperative and no distress  Lymphatics/Throat:  no asymmetry, masses, or scars  Respiratory:  clear to auscultation bilaterally  Cardiovascular:  regular rate and rhythm  Gastrointestinal: soft, non-tender; bowel sounds normal; no masses,  no organomegaly.    Musculoskeletal: Steady gait and movement  Skin: Cool and moist   Psychiatric: Normal affect, non-agitated, not confused  Rectal: Chaperone present for exam.  External exam with large skin tags in anterior/posterior location.  DRE revealed internal hemorrhoids. Non-tender, no discharge or gross blood noted.    LABS:  n/a  RADS: CLINICAL DATA: Follow-up enteritis seen on recent CT.  EXAM: CT ABDOMEN AND PELVIS WITH CONTRAST (ENTEROGRAPHY)  TECHNIQUE: Multidetector CT of the abdomen and pelvis during bolus administration of intravenous contrast. Negative oral contrast was given.  CONTRAST: 129mL OMNIPAQUE IOHEXOL 300 MG/ML SOLN  COMPARISON: 07/16/2019  FINDINGS: Lower Chest: No acute findings.  Hepatobiliary: No hepatic masses identified. Gallbladder is unremarkable. No evidence of biliary ductal dilatation.  Pancreas: No mass or inflammatory changes.  Spleen: Within normal limits in size and appearance.  Adrenals/Urinary Tract: No masses identified. No evidence of hydronephrosis.  Stomach/Bowel: Moderate wall thickening and mucosal hyper-enhancement are again seen involving the terminal ileum. There is also mild wall thickening and hyperenhancement involving other loops of distal ileum the pelvis. These findings show no significant change compared to previous study. No evidence of bowel obstruction. No evidence of fistula or abscess. No evidence of colonic involvement. Normal appendix visualized.  Vascular/Lymphatic: No pathologically enlarged lymph nodes. No abdominal aortic aneurysm. Aortic atherosclerosis.  Reproductive: No mass or other significant abnormality.  Other: None.  Musculoskeletal: No suspicious bone lesions identified.  IMPRESSION: No significant change in mild-to-moderate enteritis involving the distal ileum and terminal ileum, highly suspicious for Crohn's disease.  No evidence of abscess, bowel obstruction, or other complication.   Electronically Signed  By: Marlaine Hind M.D.  On:  08/20/2019 13:34 CLINICAL DATA: Microscopic hematuria.  EXAM: CT ABDOMEN AND PELVIS WITHOUT AND WITH CONTRAST  TECHNIQUE:  Multidetector CT imaging of the abdomen and pelvis was performed following the standard protocol before and following the bolus administration of intravenous contrast.  CONTRAST: 129mL OMNIPAQUE IOHEXOL 300 MG/ML SOLN  COMPARISON: 07/25/2010  FINDINGS: Lower chest: No acute abnormality.  Hepatobiliary: No focal liver abnormality is seen. No gallstones, gallbladder wall thickening, or biliary dilatation.  Pancreas: Unremarkable. No pancreatic ductal dilatation or surrounding inflammatory changes.  Spleen: Normal in size without focal abnormality.  Adrenals/Urinary Tract: Normal appearance of the adrenal glands.  No kidney stones identified bilaterally. No ureteral lithiasis. No kidney mass or hydronephrosis. On the delayed images there is no suspicious filling defects identified within the collecting systems or ureters. The bladder appears unremarkable.  Stomach/Bowel: Stomach appears normal. There is an abnormal appearance of the small bowel. Several short segments of bowel wall thickening and mucosal enhancement are identified with intervening areas of normal small bowel. The terminal ileum appears increased in caliber. There is abnormal, asymmetric wall thickening of the terminal ileum the wall thickening of the terminal ileum measures up to 11 mm, image 49/13. No abnormal dilatation of the colon.  Vascular/Lymphatic: Aortic atherosclerosis. No aneurysm. No enlarged abdominopelvic lymph nodes.  Reproductive: Uterus and bilateral adnexa are unremarkable.  Other: No free fluid or fluid collections identified. No findings to suggest acute inflammation.  Musculoskeletal: No acute or significant osseous findings.  IMPRESSION: 1. No urinary tract calculi or focal urinary tract lesion identified to explain patient's hematuria. 2. Abnormal  appearance of the small bowel loops including the terminal ileum. Findings are suggestive of chronic sequelae of inflammatory bowel disease, Crohn's disease not excluded. Consider GI consultation for further workup. 3. Aortic Atherosclerosis (ICD10-I70.0).   Electronically Signed  By: Kerby Moors M.D.  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  Reviewed CT images along with the colonoscopy report personally.  I agree with the GI providers concern about this being Crohn's and a pending small bowel obstruction.  Remote chance of a neoplasm cannot be excluded either.  Therefore proceeding with surgical resection would likely be appropriate at this time.  We will proceed with hand-assisted laparoscopic ileocecectomy/right colectomy depending on what we find.  Discussed The risk oflaparoscopic colon resectionsurgery includes, but not limited to, recurrence, bleeding, chronic pain, post-op infxn, post-op SBO or ileus, hernias, resection of bowel, re-anastamosis, possible ostomy placement and need for re-operation to address said risks. The risks of general anesthetic, if used, includes MI, CVA, sudden death or even reaction to anesthetic medications also discussed. Alternatives include continued observation.Benefits include possible symptom relief, preventing further decline in health and possible death.  Typical post-op recovery time of additional days in hospital for observation afterwards also discussed.  Prep ordered. Will proceed with ERAS protocol as well. Pending medical clearance and workup as noted above.   The patientverbalized understanding and all questions were answered to the patient's satisfaction.  2) External anal skin tags and hemorrhoids.  Asymptomatic except for hygiene issues.  Has not tried a bidet to maintain better hygiene.  Due to  the more pressing issue of above, she would like to defer any surgical treatment at this time.  I stated to her that we can address the hemorrhoidal skin tags at any time.

## 2019-10-05 ENCOUNTER — Other Ambulatory Visit: Payer: Self-pay

## 2019-10-05 ENCOUNTER — Encounter
Admission: RE | Admit: 2019-10-05 | Discharge: 2019-10-05 | Disposition: A | Discharge status: Home / Self Care | Payer: BC Managed Care – PPO | Source: Ambulatory Visit | Attending: Surgery | Admitting: Surgery

## 2019-10-05 DIAGNOSIS — Z20828 Contact with and (suspected) exposure to other viral communicable diseases: Secondary | ICD-10-CM | POA: Diagnosis not present

## 2019-10-05 DIAGNOSIS — Z01812 Encounter for preprocedural laboratory examination: Secondary | ICD-10-CM | POA: Insufficient documentation

## 2019-10-05 HISTORY — DX: Gastro-esophageal reflux disease without esophagitis: K21.9

## 2019-10-05 LAB — TYPE AND SCREEN
ABO/RH(D): B NEG
Antibody Screen: NEGATIVE

## 2019-10-05 NOTE — Patient Instructions (Signed)
Your procedure is scheduled on: 10-08-19 THURSDAY Report to Same Day Surgery 2nd floor medical mall [facility] Entrance-take elevator on left to 2nd floor.  Check in with surgery information desk.) To find out your arrival time please call [phone] between 1PM - 3PM on 10-07-19 [facility]  Remember: Instructions that are not followed completely may result in serious medical risk, up to and including death, or upon the discretion of your surgeon and anesthesiologist your surgery may need to be rescheduled.    _x___ 1. Do not eat food after midnight the night before your procedure. NO GUM OR CANDY AFTER MIDNIGHT.  You may drink clear liquids up to 2 hours before you are scheduled to arrive at the hospital for your procedure.  Do not drink clear liquids within 2 hours of your scheduled arrival to the hospital.  Clear liquids include  --Water or Apple juice without pulp  -- Gatorade  --Black Coffee or Clear Tea (No milk, no creamers, do not add anything to the coffee or Tea   ____Ensure clear carbohydrate drink on the way to the hospital for bariatric patients  ____Ensure clear carbohydrate drink 3 hours before surgery.    __x__ 2. No Alcohol for 24 hours before or after surgery.   __x__3. No Smoking or e-cigarettes for 24 prior to surgery.  Do not use any chewable tobacco products for at least 6 hour prior to surgery   ____  4. Bring all medications with you on the day of surgery if instructed.    __x__ 5. Notify your doctor if there is any change in your medical condition     (cold, fever, infections).    x___6. On the morning of surgery brush your teeth with toothpaste and water.  You may rinse your mouth with mouth wash if you wish.  Do not swallow any toothpaste or mouthwash.   Do not wear jewelry, make-up, hairpins, clips or nail polish.  Do not wear lotions, powders, or perfumes. You may wear deodorant.  Do not shave 48 hours prior to surgery. Men may shave face and  neck.  Do not bring valuables to the hospital.    [facility] is not responsible for any belongings or valuables.               Contacts, dentures or bridgework may not be worn into surgery.  Leave your suitcase in the car. After surgery it may be brought to your room.  For patients admitted to the hospital, discharge time is determined by your treatment team.  _  Patients discharged the day of surgery will not be allowed to drive home.  You will need someone to drive you home and stay with you the night of your procedure.    Please read over the following fact sheets that you were given:   [facility] Preparing for Surgery   _x___ TAKE THE FOLLOWING MEDICATION THE MORNING OF SURGERY WITH A SMALL SIP OF WATER. These include:  1. PREDNISONE  2.  3.  4.  5.  6.  ____Fleets enema or Magnesium Citrate as directed.   _x___ Use CHG Soap or sage wipes as directed on instruction sheet   ____ Use inhalers on the day of surgery and bring to hospital day of surgery  ____ Stop Metformin and Janumet 2 days prior to surgery.    ____ Take 1/2 of usual insulin dose the night before surgery and none on the morning surgery.   ____ Follow recommendations from Cardiologist,  Pulmonologist or PCP regarding stopping Aspirin, Coumadin, Plavix ,Eliquis, Effient, or Pradaxa, and Pletal.  X____Stop Anti-inflammatories such as Advil, Aleve, Ibuprofen, Motrin, Naproxen, Naprosyn, Goodies powders or aspirin products NOW-OK to take Tylenol    ____ Stop supplements until after surgery.     ____ Bring C-Pap to the hospital.

## 2019-10-06 LAB — SARS CORONAVIRUS 2 (TAT 6-24 HRS): SARS Coronavirus 2: NEGATIVE

## 2019-10-07 MED ORDER — SODIUM CHLORIDE 0.9 % IV SOLN
2.0000 g | INTRAVENOUS | Status: AC
  Administered 2019-10-08: 11:00:00 2 g via INTRAVENOUS
  Filled 2019-10-07: qty 2

## 2019-10-08 ENCOUNTER — Encounter
Admission: RE | Disposition: A | Discharge status: Home / Self Care | Payer: Self-pay | Source: Home / Self Care | Attending: Surgery

## 2019-10-08 ENCOUNTER — Encounter: Payer: Self-pay | Admitting: *Deleted

## 2019-10-08 ENCOUNTER — Inpatient Hospital Stay
Admission: RE | Admit: 2019-10-08 | Discharge: 2019-10-10 | DRG: 330 | Disposition: A | Discharge status: Home / Self Care | Payer: BC Managed Care – PPO | Attending: Surgery | Admitting: Surgery

## 2019-10-08 ENCOUNTER — Other Ambulatory Visit: Payer: Self-pay

## 2019-10-08 ENCOUNTER — Inpatient Hospital Stay: Payer: BC Managed Care – PPO | Admitting: Anesthesiology

## 2019-10-08 DIAGNOSIS — Z23 Encounter for immunization: Secondary | ICD-10-CM | POA: Diagnosis not present

## 2019-10-08 DIAGNOSIS — K219 Gastro-esophageal reflux disease without esophagitis: Secondary | ICD-10-CM | POA: Diagnosis present

## 2019-10-08 DIAGNOSIS — K648 Other hemorrhoids: Secondary | ICD-10-CM | POA: Diagnosis present

## 2019-10-08 DIAGNOSIS — K565 Intestinal adhesions [bands], unspecified as to partial versus complete obstruction: Secondary | ICD-10-CM | POA: Diagnosis present

## 2019-10-08 DIAGNOSIS — E785 Hyperlipidemia, unspecified: Secondary | ICD-10-CM | POA: Diagnosis present

## 2019-10-08 DIAGNOSIS — Z833 Family history of diabetes mellitus: Secondary | ICD-10-CM | POA: Diagnosis not present

## 2019-10-08 DIAGNOSIS — E7800 Pure hypercholesterolemia, unspecified: Secondary | ICD-10-CM | POA: Diagnosis present

## 2019-10-08 DIAGNOSIS — K56699 Other intestinal obstruction unspecified as to partial versus complete obstruction: Secondary | ICD-10-CM | POA: Diagnosis present

## 2019-10-08 DIAGNOSIS — K644 Residual hemorrhoidal skin tags: Secondary | ICD-10-CM | POA: Diagnosis present

## 2019-10-08 DIAGNOSIS — Z8249 Family history of ischemic heart disease and other diseases of the circulatory system: Secondary | ICD-10-CM | POA: Diagnosis not present

## 2019-10-08 DIAGNOSIS — K5080 Crohn's disease of both small and large intestine without complications: Secondary | ICD-10-CM | POA: Diagnosis present

## 2019-10-08 DIAGNOSIS — Z8042 Family history of malignant neoplasm of prostate: Secondary | ICD-10-CM | POA: Diagnosis not present

## 2019-10-08 DIAGNOSIS — K56609 Unspecified intestinal obstruction, unspecified as to partial versus complete obstruction: Secondary | ICD-10-CM | POA: Diagnosis present

## 2019-10-08 HISTORY — PX: LAPAROSCOPIC RIGHT COLECTOMY: SHX5925

## 2019-10-08 HISTORY — PX: COLOSTOMY REVISION: SHX5232

## 2019-10-08 LAB — CBC
HCT: 40.8 % (ref 36.0–46.0)
Hemoglobin: 13.6 g/dL (ref 12.0–15.0)
MCH: 30.1 pg (ref 26.0–34.0)
MCHC: 33.3 g/dL (ref 30.0–36.0)
MCV: 90.3 fL (ref 80.0–100.0)
Platelets: 425 10*3/uL — ABNORMAL HIGH (ref 150–400)
RBC: 4.52 MIL/uL (ref 3.87–5.11)
RDW: 12 % (ref 11.5–15.5)
WBC: 24.1 10*3/uL — ABNORMAL HIGH (ref 4.0–10.5)
nRBC: 0 % (ref 0.0–0.2)

## 2019-10-08 LAB — CREATININE, SERUM
Creatinine, Ser: 0.99 mg/dL (ref 0.44–1.00)
GFR calc Af Amer: >60 mL/min (ref >60)
GFR calc non Af Amer: >60 mL/min (ref >60)

## 2019-10-08 SURGERY — COLECTOMY, RIGHT
Anesthesia: General | Laterality: Right

## 2019-10-08 MED ORDER — CELECOXIB 200 MG PO CAPS
200.0000 mg | ORAL_CAPSULE | Freq: Two times a day (BID) | ORAL | Status: DC
  Administered 2019-10-08 – 2019-10-10 (×4): 200 mg via ORAL
  Filled 2019-10-08 (×5): qty 1

## 2019-10-08 MED ORDER — CELECOXIB 200 MG PO CAPS
200.0000 mg | ORAL_CAPSULE | ORAL | Status: AC
  Administered 2019-10-08: 10:00:00 200 mg via ORAL

## 2019-10-08 MED ORDER — PANTOPRAZOLE SODIUM 40 MG IV SOLR
40.0000 mg | Freq: Every day | INTRAVENOUS | Status: DC
  Administered 2019-10-08 – 2019-10-09 (×2): 40 mg via INTRAVENOUS
  Filled 2019-10-08 (×2): qty 40

## 2019-10-08 MED ORDER — ESTRADIOL 1 MG PO TABS
1.0000 mg | ORAL_TABLET | Freq: Every day | ORAL | Status: DC
  Administered 2019-10-08 – 2019-10-09 (×2): 1 mg via ORAL
  Filled 2019-10-08 (×3): qty 1

## 2019-10-08 MED ORDER — FENTANYL CITRATE (PF) 100 MCG/2ML IJ SOLN
INTRAMUSCULAR | Status: AC
  Filled 2019-10-08: qty 2

## 2019-10-08 MED ORDER — EPHEDRINE SULFATE 50 MG/ML IJ SOLN
INTRAMUSCULAR | Status: DC | PRN
  Administered 2019-10-08: 5 mg via INTRAVENOUS
  Administered 2019-10-08: 10 mg via INTRAVENOUS

## 2019-10-08 MED ORDER — ROCURONIUM BROMIDE 50 MG/5ML IV SOLN
INTRAVENOUS | Status: AC
  Filled 2019-10-08: qty 1

## 2019-10-08 MED ORDER — DEXAMETHASONE SODIUM PHOSPHATE 10 MG/ML IJ SOLN
INTRAMUSCULAR | Status: DC | PRN
  Administered 2019-10-08: 10 mg via INTRAVENOUS

## 2019-10-08 MED ORDER — FAMOTIDINE 20 MG PO TABS
ORAL_TABLET | ORAL | Status: AC
  Administered 2019-10-08: 20 mg via ORAL
  Filled 2019-10-08: qty 1

## 2019-10-08 MED ORDER — GABAPENTIN 300 MG PO CAPS
300.0000 mg | ORAL_CAPSULE | Freq: Two times a day (BID) | ORAL | Status: DC
  Administered 2019-10-08 – 2019-10-10 (×4): 300 mg via ORAL
  Filled 2019-10-08 (×4): qty 1

## 2019-10-08 MED ORDER — BUPIVACAINE-EPINEPHRINE (PF) 0.5% -1:200000 IJ SOLN
INTRAMUSCULAR | Status: DC | PRN
  Administered 2019-10-08: 14 mL

## 2019-10-08 MED ORDER — ONDANSETRON HCL 4 MG/2ML IJ SOLN: 4.0000 mg | Freq: Four times a day (QID) | INTRAMUSCULAR | Status: DC | PRN

## 2019-10-08 MED ORDER — ROCURONIUM BROMIDE 100 MG/10ML IV SOLN
INTRAVENOUS | Status: DC | PRN
  Administered 2019-10-08: 10 mg via INTRAVENOUS
  Administered 2019-10-08: 40 mg via INTRAVENOUS
  Administered 2019-10-08 (×2): 10 mg via INTRAVENOUS

## 2019-10-08 MED ORDER — LIDOCAINE HCL (CARDIAC) PF 100 MG/5ML IV SOSY
PREFILLED_SYRINGE | INTRAVENOUS | Status: DC | PRN
  Administered 2019-10-08: 80 mg via INTRAVENOUS

## 2019-10-08 MED ORDER — INFLUENZA VAC SPLIT QUAD 0.5 ML IM SUSY
0.5000 mL | PREFILLED_SYRINGE | INTRAMUSCULAR | Status: AC
  Administered 2019-10-09: 10:00:00 0.5 mL via INTRAMUSCULAR
  Filled 2019-10-08: qty 0.5

## 2019-10-08 MED ORDER — GABAPENTIN 300 MG PO CAPS
300.0000 mg | ORAL_CAPSULE | ORAL | Status: AC
  Administered 2019-10-08: 10:00:00 300 mg via ORAL

## 2019-10-08 MED ORDER — ONDANSETRON HCL 4 MG/2ML IJ SOLN
INTRAMUSCULAR | Status: DC | PRN
  Administered 2019-10-08: 4 mg via INTRAVENOUS

## 2019-10-08 MED ORDER — LACTATED RINGERS IV SOLN
INTRAVENOUS | Status: DC
  Administered 2019-10-08 – 2019-10-09 (×3): via INTRAVENOUS

## 2019-10-08 MED ORDER — ONDANSETRON HCL 4 MG/2ML IJ SOLN: 4.0000 mg | Freq: Once | INTRAMUSCULAR | Status: DC | PRN

## 2019-10-08 MED ORDER — LACTATED RINGERS IV SOLN
INTRAVENOUS | Status: DC
  Administered 2019-10-08 (×2): via INTRAVENOUS

## 2019-10-08 MED ORDER — NORETHINDRONE ACETATE 5 MG PO TABS
2.5000 mg | ORAL_TABLET | Freq: Every evening | ORAL | Status: DC
  Administered 2019-10-08 – 2019-10-09 (×2): 2.5 mg via ORAL
  Filled 2019-10-08 (×3): qty 1

## 2019-10-08 MED ORDER — FENTANYL CITRATE (PF) 100 MCG/2ML IJ SOLN
25.0000 ug | INTRAMUSCULAR | Status: DC | PRN
  Administered 2019-10-08: 15:00:00 25 ug via INTRAVENOUS

## 2019-10-08 MED ORDER — MIDAZOLAM HCL 2 MG/2ML IJ SOLN
INTRAMUSCULAR | Status: AC
  Filled 2019-10-08: qty 2

## 2019-10-08 MED ORDER — MIDAZOLAM HCL 2 MG/2ML IJ SOLN
INTRAMUSCULAR | Status: DC | PRN
  Administered 2019-10-08: 2 mg via INTRAVENOUS

## 2019-10-08 MED ORDER — PHENYLEPHRINE HCL (PRESSORS) 10 MG/ML IV SOLN
INTRAVENOUS | Status: DC | PRN
  Administered 2019-10-08 (×3): 100 ug via INTRAVENOUS

## 2019-10-08 MED ORDER — ONDANSETRON HCL 4 MG/2ML IJ SOLN
INTRAMUSCULAR | Status: AC
  Filled 2019-10-08: qty 2

## 2019-10-08 MED ORDER — FAMOTIDINE 20 MG PO TABS
20.0000 mg | ORAL_TABLET | Freq: Once | ORAL | Status: AC
  Administered 2019-10-08: 10:00:00 20 mg via ORAL

## 2019-10-08 MED ORDER — SODIUM CHLORIDE 0.9 % IV SOLN
1.0000 g | Freq: Two times a day (BID) | INTRAVENOUS | Status: DC
  Administered 2019-10-08 – 2019-10-10 (×4): 1 g via INTRAVENOUS
  Filled 2019-10-08 (×5): qty 1

## 2019-10-08 MED ORDER — ACETAMINOPHEN 325 MG PO TABS: 650.0000 mg | ORAL_TABLET | Freq: Four times a day (QID) | ORAL | Status: DC | PRN

## 2019-10-08 MED ORDER — ONDANSETRON 4 MG PO TBDP: 4.0000 mg | ORAL_TABLET | Freq: Four times a day (QID) | ORAL | Status: DC | PRN

## 2019-10-08 MED ORDER — FENTANYL CITRATE (PF) 100 MCG/2ML IJ SOLN
INTRAMUSCULAR | Status: AC
  Administered 2019-10-08: 25 ug via INTRAVENOUS
  Filled 2019-10-08: qty 2

## 2019-10-08 MED ORDER — CELECOXIB 200 MG PO CAPS
ORAL_CAPSULE | ORAL | Status: AC
  Administered 2019-10-08: 200 mg via ORAL
  Filled 2019-10-08: qty 2

## 2019-10-08 MED ORDER — HYDROCODONE-ACETAMINOPHEN 5-325 MG PO TABS
1.0000 | ORAL_TABLET | ORAL | Status: DC | PRN
  Administered 2019-10-09 (×3): 2 via ORAL
  Administered 2019-10-10: 1 via ORAL
  Filled 2019-10-08 (×3): qty 2
  Filled 2019-10-08: qty 1

## 2019-10-08 MED ORDER — ENOXAPARIN SODIUM 40 MG/0.4ML ~~LOC~~ SOLN
40.0000 mg | SUBCUTANEOUS | Status: DC
  Administered 2019-10-09: 40 mg via SUBCUTANEOUS
  Filled 2019-10-08: qty 0.4

## 2019-10-08 MED ORDER — ACETAMINOPHEN 500 MG PO TABS
ORAL_TABLET | ORAL | Status: AC
  Administered 2019-10-08: 1000 mg via ORAL
  Filled 2019-10-08: qty 2

## 2019-10-08 MED ORDER — CHLORHEXIDINE GLUCONATE CLOTH 2 % EX PADS: 6.0000 | MEDICATED_PAD | Freq: Once | CUTANEOUS | Status: DC

## 2019-10-08 MED ORDER — SODIUM CHLORIDE 0.9 % IV SOLN
INTRAVENOUS | Status: DC | PRN
  Administered 2019-10-08: 80 mL

## 2019-10-08 MED ORDER — LIDOCAINE HCL (PF) 2 % IJ SOLN
INTRAMUSCULAR | Status: AC
  Filled 2019-10-08: qty 10

## 2019-10-08 MED ORDER — ACETAMINOPHEN 500 MG PO TABS
1000.0000 mg | ORAL_TABLET | ORAL | Status: AC
  Administered 2019-10-08: 10:00:00 1000 mg via ORAL

## 2019-10-08 MED ORDER — PROPOFOL 10 MG/ML IV BOLUS
INTRAVENOUS | Status: DC | PRN
  Administered 2019-10-08: 170 mg via INTRAVENOUS

## 2019-10-08 MED ORDER — SIMVASTATIN 20 MG PO TABS
20.0000 mg | ORAL_TABLET | Freq: Every day | ORAL | Status: DC
  Administered 2019-10-08 – 2019-10-09 (×2): 20 mg via ORAL
  Filled 2019-10-08 (×3): qty 1

## 2019-10-08 MED ORDER — PROPOFOL 10 MG/ML IV BOLUS
INTRAVENOUS | Status: AC
  Filled 2019-10-08: qty 20

## 2019-10-08 MED ORDER — SUGAMMADEX SODIUM 200 MG/2ML IV SOLN
INTRAVENOUS | Status: AC
  Filled 2019-10-08: qty 2

## 2019-10-08 MED ORDER — FENTANYL CITRATE (PF) 100 MCG/2ML IJ SOLN
INTRAMUSCULAR | Status: DC | PRN
  Administered 2019-10-08 (×2): 50 ug via INTRAVENOUS
  Administered 2019-10-08: 100 ug via INTRAVENOUS
  Administered 2019-10-08: 50 ug via INTRAVENOUS

## 2019-10-08 MED ORDER — GABAPENTIN 300 MG PO CAPS
ORAL_CAPSULE | ORAL | Status: AC
  Administered 2019-10-08: 300 mg via ORAL
  Filled 2019-10-08: qty 1

## 2019-10-08 MED ORDER — IBUPROFEN 400 MG PO TABS
600.0000 mg | ORAL_TABLET | Freq: Four times a day (QID) | ORAL | Status: DC | PRN
  Filled 2019-10-08: qty 2

## 2019-10-08 SURGICAL SUPPLY — 69 items
APPLIER CLIP 5 13 M/L LIGAMAX5 (MISCELLANEOUS) ×4
BLADE SURG SZ10 CARB STEEL (BLADE) ×4 IMPLANT
BLADE SURG SZ11 CARB STEEL (BLADE) ×2 IMPLANT
CANISTER SUCT 1200ML W/VALVE (MISCELLANEOUS) ×4 IMPLANT
CLIP APPLIE 5 13 M/L LIGAMAX5 (MISCELLANEOUS) IMPLANT
COVER WAND RF STERILE (DRAPES) ×4 IMPLANT
DECANTER SPIKE VIAL GLASS SM (MISCELLANEOUS) ×4 IMPLANT
DEFOGGER SCOPE WARMER CLEARIFY (MISCELLANEOUS) ×2 IMPLANT
DERMABOND ADVANCED (GAUZE/BANDAGES/DRESSINGS) ×4
DERMABOND ADVANCED .7 DNX12 (GAUZE/BANDAGES/DRESSINGS) ×4 IMPLANT
DRAPE INCISE IOBAN 66X45 STRL (DRAPES) ×2 IMPLANT
DRSG OPSITE POSTOP 4X6 (GAUZE/BANDAGES/DRESSINGS) ×2 IMPLANT
ELECT BLADE 6.5 EXT (BLADE) ×4 IMPLANT
ELECT CAUTERY BLADE 6.4 (BLADE) ×4 IMPLANT
ELECT REM PT RETURN 9FT ADLT (ELECTROSURGICAL) ×4
ELECTRODE REM PT RTRN 9FT ADLT (ELECTROSURGICAL) ×2 IMPLANT
GLOVE BIOGEL PI IND STRL 7.0 (GLOVE) ×4 IMPLANT
GLOVE BIOGEL PI INDICATOR 7.0 (GLOVE) ×10
GLOVE SURG SYN 6.5 ES PF (GLOVE) ×12 IMPLANT
GLOVE SURG SYN 6.5 PF PI (GLOVE) ×4 IMPLANT
GOWN STRL REUS W/ TWL LRG LVL3 (GOWN DISPOSABLE) ×8 IMPLANT
GOWN STRL REUS W/TWL LRG LVL3 (GOWN DISPOSABLE) ×14
HANDLE YANKAUER SUCT BULB TIP (MISCELLANEOUS) ×4 IMPLANT
HOLDER FOLEY CATH W/STRAP (MISCELLANEOUS) ×4 IMPLANT
IRRIGATION STRYKERFLOW (MISCELLANEOUS) IMPLANT
IRRIGATOR STRYKERFLOW (MISCELLANEOUS)
IV NS 1000ML (IV SOLUTION)
IV NS 1000ML BAXH (IV SOLUTION) ×2 IMPLANT
LIGASURE LAP MARYLAND 5MM 37CM (ELECTROSURGICAL) ×2 IMPLANT
NEEDLE HYPO 22GX1.5 SAFETY (NEEDLE) ×6 IMPLANT
NS IRRIG 1000ML POUR BTL (IV SOLUTION) ×4 IMPLANT
PACK COLON CLEAN CLOSURE (MISCELLANEOUS) ×4 IMPLANT
PACK LAP CHOLECYSTECTOMY (MISCELLANEOUS) ×4 IMPLANT
PENCIL ELECTRO HAND CTR (MISCELLANEOUS) ×4 IMPLANT
PORT ACCESS TROCAR AIRSEAL 5 (TROCAR) ×6 IMPLANT
RELOAD PROXIMATE 75MM BLUE (ENDOMECHANICALS) ×4 IMPLANT
RELOAD STAPLE 60 3.6 BLU REG (STAPLE) IMPLANT
RELOAD STAPLE 75 3.8 BLU REG (ENDOMECHANICALS) IMPLANT
RELOAD STAPLER BLUE 60MM (STAPLE) ×6 IMPLANT
RETRACTOR WOUND ALXS 18CM MED (MISCELLANEOUS) IMPLANT
RTRCTR WOUND ALEXIS O 18CM MED (MISCELLANEOUS)
SCISSORS METZENBAUM CVD 33 (INSTRUMENTS) ×2 IMPLANT
SET TRI-LUMEN FLTR TB AIRSEAL (TUBING) ×4 IMPLANT
SHEARS HARMONIC ACE PLUS 36CM (ENDOMECHANICALS) ×2 IMPLANT
SLEEVE ENDOPATH XCEL 5M (ENDOMECHANICALS) ×4 IMPLANT
SPONGE LAP 18X18 RF (DISPOSABLE) ×10 IMPLANT
STAPLE ECHEON FLEX 60 POW ENDO (STAPLE) ×2 IMPLANT
STAPLER GUN LINEAR PROX 60 (STAPLE) ×2 IMPLANT
STAPLER PROXIMATE 75MM BLUE (STAPLE) ×2 IMPLANT
STAPLER RELOAD BLUE 60MM (STAPLE) ×12
STAPLER SKIN PROX 35W (STAPLE) ×2 IMPLANT
SUT MNCRL 4-0 (SUTURE) ×4
SUT MNCRL 4-0 27XMFL (SUTURE) ×4
SUT PDS AB 0 CT1 27 (SUTURE) ×8 IMPLANT
SUT SILK 2 0 (SUTURE) ×2
SUT SILK 2-0 30XBRD TIE 12 (SUTURE) ×2 IMPLANT
SUT SILK 3-0 (SUTURE) ×4 IMPLANT
SUT VIC AB 2-0 SH 27 (SUTURE) ×2
SUT VIC AB 2-0 SH 27XBRD (SUTURE) ×2 IMPLANT
SUT VIC AB 3-0 SH 27 (SUTURE) ×2
SUT VIC AB 3-0 SH 27X BRD (SUTURE) IMPLANT
SUT VICRYL 0 AB UR-6 (SUTURE) ×6 IMPLANT
SUTURE MNCRL 4-0 27XMF (SUTURE) ×4 IMPLANT
SYR 30ML LL (SYRINGE) ×4 IMPLANT
SYS LAPSCP GELPORT 120MM (MISCELLANEOUS)
SYSTEM LAPSCP GELPORT 120MM (MISCELLANEOUS) ×2 IMPLANT
TRAY FOLEY MTR SLVR 16FR STAT (SET/KITS/TRAYS/PACK) ×4 IMPLANT
TROCAR XCEL 12X100 BLDLESS (ENDOMECHANICALS) ×2 IMPLANT
TROCAR XCEL NON-BLD 5MMX100MML (ENDOMECHANICALS) ×2 IMPLANT

## 2019-10-08 NOTE — Anesthesia Postprocedure Evaluation (Signed)
Anesthesia Post Note  Patient: Patricia Callahan  Procedure(s) Performed: ATTEMPTED LAPAROSCOPIC RIGHT COLECTOMY (Right ) COLON RESECTION RIGHT  Patient location during evaluation: PACU Anesthesia Type: General Level of consciousness: awake and alert and oriented Pain management: pain level controlled Vital Signs Assessment: post-procedure vital signs reviewed and stable Respiratory status: spontaneous breathing Cardiovascular status: blood pressure returned to baseline Anesthetic complications: no     Last Vitals:  Vitals:   10/08/19 1531 10/08/19 1546  BP: (!) 157/84 (!) 144/63  Pulse: 89 90  Resp: 19 19  Temp:    SpO2: 95% 96%    Last Pain:  Vitals:   10/08/19 1546  TempSrc:   PainSc: 3                  [name]

## 2019-10-08 NOTE — Transfer of Care (Signed)
Immediate Anesthesia Transfer of Care Note  Patient: Patricia Callahan  Procedure(s) Performed: ATTEMPTED LAPAROSCOPIC RIGHT COLECTOMY (Right ) COLON RESECTION RIGHT  Patient Location: PACU  Anesthesia Type:General  Level of Consciousness: sedated  Airway & Oxygen Therapy: Patient Spontanous Breathing and Patient connected to face mask oxygen  Post-op Assessment: Report given to RN and Post -op Vital signs reviewed and stable  Post vital signs: Reviewed and stable  Last Vitals:  Vitals Value Taken Time  BP 142/69 10/08/19 1416  Temp 36.8 C 10/08/19 1416  Pulse 86 10/08/19 1420  Resp 17 10/08/19 1420  SpO2 97 % 10/08/19 1420  Vitals shown include unvalidated device data.  Last Pain:  Vitals:   10/08/19 1416  TempSrc:   PainSc: Asleep         Complications: No apparent anesthesia complications

## 2019-10-08 NOTE — Anesthesia Post-op Follow-up Note (Signed)
Anesthesia QCDR form completed.        

## 2019-10-08 NOTE — Anesthesia Procedure Notes (Signed)
Procedure Name: Intubation Date/Time: 10/08/2019 11:00 AM Performed by: Allean Found, CRNA Pre-anesthesia Checklist: Patient identified, Patient being monitored, Timeout performed, Emergency Drugs available and Suction available Patient Re-evaluated:Patient Re-evaluated prior to induction Oxygen Delivery Method: Circle system utilized Preoxygenation: Pre-oxygenation with 100% oxygen Induction Type: IV induction Ventilation: Mask ventilation without difficulty Laryngoscope Size: McGraph and 4 Grade View: Grade I Tube type: Oral Tube size: 7.0 mm Number of attempts: 1 Airway Equipment and Method: Stylet Placement Confirmation: ETT inserted through vocal cords under direct vision,  positive ETCO2 and breath sounds checked- equal and bilateral Secured at: 21 cm Tube secured with: Tape Dental Injury: Teeth and Oropharynx as per pre-operative assessment

## 2019-10-08 NOTE — Anesthesia Preprocedure Evaluation (Signed)
Anesthesia Evaluation  Patient identified by MRN, date of birth, ID band Patient awake    Reviewed: Allergy & Precautions, H&P , NPO status , Patient's Chart, lab work & pertinent test results, reviewed documented beta blocker date and time   Airway Mallampati: II   Neck ROM: full    Dental  (+) Poor Dentition   Pulmonary neg pulmonary ROS,    Pulmonary exam normal        Cardiovascular Exercise Tolerance: Good negative cardio ROS Normal cardiovascular exam Rhythm:regular Rate:Normal     Neuro/Psych negative neurological ROS  negative psych ROS   GI/Hepatic Neg liver ROS, GERD  ,  Endo/Other  negative endocrine ROS  Renal/GU negative Renal ROS  negative genitourinary   Musculoskeletal   Abdominal   Peds negative pediatric ROS (+)  Hematology negative hematology ROS (+)   Anesthesia Other Findings Past Medical History: No date: Hypercholesteremia No date: Vasomotor symptoms due to menopause Past Surgical History: 2015: COLONOSCOPY     Comment:  normal, was advised to return in 10 years BMI    Body Mass Index: 29.87 kg/m     Reproductive/Obstetrics negative OB ROS                             Anesthesia Physical  Anesthesia Plan  ASA: II  Anesthesia Plan: General   Post-op Pain Management:    Induction:   PONV Risk Score and Plan:   Airway Management Planned: Oral ETT  Additional Equipment:   Intra-op Plan:   Post-operative Plan: Extubation in OR  Informed Consent: I have reviewed the patients History and Physical, chart, labs and discussed the procedure including the risks, benefits and alternatives for the proposed anesthesia with the patient or authorized representative who has indicated his/her understanding and acceptance.     Dental Advisory Given  Plan Discussed with: CRNA and Surgeon  Anesthesia Plan Comments:         Anesthesia Quick  Evaluation

## 2019-10-08 NOTE — Op Note (Addendum)
Preoperative diagnosis: Terminal ileum stricture Postoperative diagnosis: Same  Procedure: Laparoscopic hand-assisted ileocecectomy Anesthesia: GETA  Surgeon: Lysle Pearl  Wound Classification: Clean Contaminated  Indications: Patient is a 58 y.o. 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  Please see H&P for further details  Specimen: Terminal ileum and cecum  Complications: Enterotomy was noted within resected specimen  Estimated Blood Loss:  75 mL  Findings: 1.  Creeping fat along the visible terminal ileum and some minor adhesions between cecum and lateral abdominal wall 2. Tension free anastomosis 3. Adequate gross circulation on anastomosis 4. Adequate hemostasis  Details of operation: The patient was brought to the operating room and placed on the operating table in the supine position. Abdomen prepped with 2% chlorhexidine solution, and draped in the standard fashion. Foley placed.  A time-out was completed verifying correct patient, procedure, site, positioning, and implant(s) and/or special equipment prior to beginning this procedure.  12 mm infraumbilical incision was made, dissection carried down to the umbilical stalk base, where fascia was lifted with Coker's.  Incision made within the fascia to enter the abdominal cavity, through which 12 mm port was placed.  Insufflation started without any dramatic increase in pressure up to 15 mmHg.  The patient was then positioned in 30 reverse Trendelenburg position with the patient's right side up to allow for small bowel to drop clear of the operative field. The 5-mm scope was introduced, no injury to surrounding bowel are noted.  Under direct visualization 2 additional 5 mm ports one in the suprapubic region and 1 in the left lower quadrant was placed.  Each site was infused with local anesthesia prior to placement.   The terminal ileum was identified and brought into view.  Extensive creeping fat was noted to surround the almost entire  circumference of the small bowel.  The bowel itself looked slightly hyperemic but no obviously inflamed.  Lateral adhesions were noted along the cecum and appendix to the abdominal wall.  Otherwise, colon and appendix looked grossly normal.  The lateral attachments were taken down using a combination of blunt dissection and LigaSure.  Point of transection was then decided upon at the terminal ileum where the creeping fat was less severe.  Mesenteric window was created using the LigaSure and the ileum was transected using a laparoscopic GIA blue load. The mesenteric dissection was completed as needed on the small bowel and cecum using the LigaSure device.  Mesenteric window was created at the base of the cecum and a reload laparoscopic GIA blue load was used to complete the specimen resection.  Laparoscope instruments were removed and the infraumbilical incision was then extended to accommodate a medium size Alexis wound protector.  All ports were removed and specimen was removed out of the abdominal cavity and off operative field, pending pathology.  Inspection of the specimen noted several areas of serosal tears and enterotomies.  No obvious spillage was noted within the operative field during the dissection process and during the second look after these enterotomies were discovered.  Grossly the specimen did feel more inflamed, compared to how they look visually through the laparoscope.  The ends of the ileum and ascending colon were brought through the extended midline incision, then aligned, ensuring that the mesentery was not twisted, and a stapled end-to-end anastomosis was created in the usual manner using a blue load 62mm GIA stapler and closing enterostomy with a TA stapler.  The new staple line was lembert sutured using 3-0 silk in a running fashion.  Newly created  and hemostatic anastomosis reintroduced into the peritoneal cavity.  No gross spillage was noted throughout this entire portion as  well.  Gloves and instruments were switched out and the fascia was closed with running PDS 0 x2.   Exparel infused to incisions prior to closing. 3-0 vicryl used to close subcutaneous layer at the hand port incision prior to closing hand port incisions with stapler.  Two 5 mm ports closed with 4-0 Monocryl in a subcuticular fashion.  Midline wound then dressed with honeycomb sponge.  Port site secured with Dermabond  The sponge and instrument count correct at end of procedure. The patient tolerated the procedure well and was transferred to the post-anesthesia care unit in stable condition.

## 2019-10-08 NOTE — Interval H&P Note (Signed)
History and Physical Interval Note:  10/08/2019 10:19 AM  Patricia Callahan  has presented today for surgery, with the diagnosis of K56.699 Small bowel stricture.  The various methods of treatment have been discussed with the patient and family. After consideration of risks, benefits and other options for treatment, the patient has consented to  Procedure(s): LAPAROSCOPIC RIGHT COLECTOMY (Right) as a surgical intervention.  The patient's history has been reviewed, patient examined, no change in status, stable for surgery.  I have reviewed the patient's chart and labs.  Questions were answered to the patient's satisfaction.     Heston Widener Lysle Pearl

## 2019-10-09 ENCOUNTER — Encounter: Payer: Self-pay | Admitting: Surgery

## 2019-10-09 LAB — CBC
HCT: 36.8 % (ref 36.0–46.0)
Hemoglobin: 12 g/dL (ref 12.0–15.0)
MCH: 29.9 pg (ref 26.0–34.0)
MCHC: 32.6 g/dL (ref 30.0–36.0)
MCV: 91.5 fL (ref 80.0–100.0)
Platelets: 404 10*3/uL — ABNORMAL HIGH (ref 150–400)
RBC: 4.02 MIL/uL (ref 3.87–5.11)
RDW: 12.2 % (ref 11.5–15.5)
WBC: 18.4 10*3/uL — ABNORMAL HIGH (ref 4.0–10.5)
nRBC: 0 % (ref 0.0–0.2)

## 2019-10-09 LAB — BASIC METABOLIC PANEL
Anion gap: 8 (ref 5–15)
BUN: 17 mg/dL (ref 6–20)
CO2: 21 mmol/L — ABNORMAL LOW (ref 22–32)
Calcium: 8.3 mg/dL — ABNORMAL LOW (ref 8.9–10.3)
Chloride: 108 mmol/L (ref 98–111)
Creatinine, Ser: 1.04 mg/dL — ABNORMAL HIGH (ref 0.44–1.00)
GFR calc Af Amer: >60 mL/min (ref >60)
GFR calc non Af Amer: 60 mL/min — ABNORMAL LOW (ref >60)
Glucose, Bld: 104 mg/dL — ABNORMAL HIGH (ref 70–99)
Potassium: 3.8 mmol/L (ref 3.5–5.1)
Sodium: 137 mmol/L (ref 135–145)

## 2019-10-09 MED ORDER — SODIUM CHLORIDE 0.9 % IV SOLN
INTRAVENOUS | Status: DC | PRN
  Administered 2019-10-09: 22:00:00 250 mL via INTRAVENOUS

## 2019-10-09 NOTE — Progress Notes (Signed)
Subjective:  CC: Patricia Callahan is a 58 y.o. female  Hospital stay day 1, 1 Day Post-Op lap hand assited ileocectomy  HPI: No issues.  Pain controlled, not requiring narcotics.  Small BMs, voiding without difficulty  ROS:  General: Denies weight loss, weight gain, fatigue, fevers, chills, and night sweats. Heart: Denies chest pain, palpitations, racing heart, irregular heartbeat, leg pain or swelling, and decreased activity tolerance. Respiratory: Denies breathing difficulty, shortness of breath, wheezing, cough, and sputum. GI: Denies change in appetite, heartburn, nausea, vomiting, constipation, diarrhea, and blood in stool. GU: Denies difficulty urinating, pain with urinating, urgency, frequency, blood in urine.   Objective:   Temp:  [97.3 F (36.3 C)-98.8 F (37.1 C)] 98.8 F (37.1 C) (11/06 0353) Pulse Rate:  [87-97] 91 (11/06 0353) Resp:  [12-27] 18 (11/06 0353) BP: (137-169)/(63-96) 161/91 (11/06 0353) SpO2:  [93 %-98 %] 98 % (11/06 0353)     Height: 5\' 4"  (162.6 cm) Weight: 79.9 kg BMI (Calculated): 30.22   Intake/Output this shift:   Intake/Output Summary (Last 24 hours) at 10/09/2019 1011 Last data filed at 10/09/2019 0353 Gross per 24 hour  Intake 2132.47 ml  Output 120 ml  Net 2012.47 ml    Constitutional :  alert, cooperative, appears stated age and no distress  Respiratory:  clear to auscultation bilaterally  Cardiovascular:  regular rate and rhythm  Gastrointestinal: soft, appropriate tender in RLQ and alond incision, with some bleeding noted on dressing.  port sites C/D/I.   Skin: Cool and moist.   Psychiatric: Normal affect, non-agitated, not confused       LABS:  CMP Latest Ref Rng & Units 10/09/2019 10/08/2019 08/24/2019  Glucose 70 - 99 mg/dL 104(H) - 86  BUN 6 - 20 mg/dL 17 - 10  Creatinine 0.44 - 1.00 mg/dL 1.04(H) 0.99 0.88  Sodium 135 - 145 mmol/L 137 - 137  Potassium 3.5 - 5.1 mmol/L 3.8 - 4.2  Chloride 98 - 111 mmol/L 108 - 101  CO2 22 - 32  mmol/L 21(L) - 22  Calcium 8.9 - 10.3 mg/dL 8.3(L) - 9.9  Total Protein 6.0 - 8.5 g/dL - - 7.0  Total Bilirubin 0.0 - 1.2 mg/dL - - 0.4  Alkaline Phos 39 - 117 IU/L - - 59  AST 0 - 40 IU/L - - 18  ALT 0 - 32 IU/L - - 14   CBC Latest Ref Rng & Units 10/09/2019 10/08/2019 08/24/2019  WBC 4.0 - 10.5 K/uL 18.4(H) 24.1(H) CANCELED  Hemoglobin 12.0 - 15.0 g/dL 12.0 13.6 CANCELED  Hematocrit 36.0 - 46.0 % 36.8 40.8 CANCELED  Platelets 150 - 400 K/uL 404(H) 425(H) CANCELED    RADS: N/A Assessment:   S/p lap hand assisted ileocectomy for ileal stricture.  Doing well postop.  ADAT.  Will continue fluids due to slight elevated Cr.  Hopefully home by tomorrow if pain controlled, tolerating diet, labs normalizing.  Will continue IV abx due to enterotomies noted intraop and home on oral abx.

## 2019-10-10 LAB — BASIC METABOLIC PANEL
Anion gap: 7 (ref 5–15)
BUN: 12 mg/dL (ref 6–20)
CO2: 22 mmol/L (ref 22–32)
Calcium: 8 mg/dL — ABNORMAL LOW (ref 8.9–10.3)
Chloride: 108 mmol/L (ref 98–111)
Creatinine, Ser: 0.89 mg/dL (ref 0.44–1.00)
GFR calc Af Amer: >60 mL/min (ref >60)
GFR calc non Af Amer: >60 mL/min (ref >60)
Glucose, Bld: 84 mg/dL (ref 70–99)
Potassium: 3.5 mmol/L (ref 3.5–5.1)
Sodium: 137 mmol/L (ref 135–145)

## 2019-10-10 LAB — CBC
HCT: 34.8 % — ABNORMAL LOW (ref 36.0–46.0)
Hemoglobin: 11.3 g/dL — ABNORMAL LOW (ref 12.0–15.0)
MCH: 30.6 pg (ref 26.0–34.0)
MCHC: 32.5 g/dL (ref 30.0–36.0)
MCV: 94.3 fL (ref 80.0–100.0)
Platelets: 358 10*3/uL (ref 150–400)
RBC: 3.69 MIL/uL — ABNORMAL LOW (ref 3.87–5.11)
RDW: 12.8 % (ref 11.5–15.5)
WBC: 14.9 10*3/uL — ABNORMAL HIGH (ref 4.0–10.5)
nRBC: 0 % (ref 0.0–0.2)

## 2019-10-10 MED ORDER — ACETAMINOPHEN 325 MG PO TABS: 650.0000 mg | ORAL_TABLET | Freq: Three times a day (TID) | ORAL | 0 refills | Status: AC | PRN

## 2019-10-10 MED ORDER — HYDROCODONE-ACETAMINOPHEN 5-325 MG PO TABS: 1.0000 | ORAL_TABLET | Freq: Four times a day (QID) | ORAL | 0 refills | Status: DC | PRN

## 2019-10-10 MED ORDER — AMOXICILLIN-POT CLAVULANATE 875-125 MG PO TABS: 1.0000 | ORAL_TABLET | Freq: Two times a day (BID) | ORAL | 0 refills | Status: AC

## 2019-10-10 MED ORDER — IBUPROFEN 800 MG PO TABS: 800.0000 mg | ORAL_TABLET | Freq: Three times a day (TID) | ORAL | 0 refills | Status: DC | PRN

## 2019-10-10 NOTE — Discharge Instructions (Signed)
Laparoscopic bowel surgery, Care After This sheet gives you information about how to care for yourself after your procedure. Your health care provider may also give you more specific instructions. If you have problems or questions, contact your health care provider. What can I expect after the procedure? After your procedure, it is common to have the following:  Pain in your abdomen, especially in the incision areas. You will be given medicine to control the pain.  Tiredness. This is a normal part of the recovery process. Your energy level will return to normal over the next several weeks.  Changes in your bowel movements, such as constipation or needing to go more often. Talk with your health care provider about how to manage this. Follow these instructions at home: Medicines   tylenol and advil as needed for discomfort.  Please alternate between the two every four hours as needed for pain.     Use narcotics, if prescribed, only when tylenol and motrin is not enough to control pain.   325-650mg  every 8hrs to max of 4000mg /24hrs (including the 325mg  in every norco dose) for the tylenol.     Advil up to 800mg  per dose every 8hrs as needed for pain.    Do not drive or use heavy machinery while taking prescription pain medicine.  Do not drink alcohol while taking prescription pain medicine.  If you were prescribed an antibiotic medicine, use it as told by your health care provider. Do not stop using the antibiotic even if you start to feel better. Incision care     Follow instructions from your health care provider about how to take care of your incision areas. Make sure you: ? Keep your incisions clean and dry. ? Wash your hands with soap and water before and after applying medicine to the areas, and before and after changing your bandage (dressing). If soap and water are not available, use hand sanitizer. ? Change your dressing as told by your health care provider. ? Leave stitches  (sutures), skin glue, or adhesive strips in place. These skin closures may need to stay in place for 2 weeks or longer. If adhesive strip edges start to loosen and curl up, you may trim the loose edges. Do not remove adhesive strips completely unless your health care provider tells you to do that.  Do not wear tight clothing over the incisions. Tight clothing may rub and irritate the incision areas, which may cause the incisions to open.  Do not take baths, swim, or use a hot tub until your health care provider approves. OK TO SHOWER.    Check your incision area every day for signs of infection. Check for: ? More redness, swelling, or pain. ? More fluid or blood. ? Warmth. ? Pus or a bad smell. Activity  Avoid lifting anything that is heavier than 10 lb (4.5 kg) for 2 weeks or until your health care provider says it is okay.  You may resume normal activities as told by your health care provider. Ask your health care provider what activities are safe for you.  Take rest breaks during the day as needed. Eating and drinking  Low fiber diet, see reference  To prevent or treat constipation while you are taking prescription pain medicine, your health care provider may recommend that you: ? Drink enough fluid to keep your urine clear or pale yellow. ? Take over-the-counter or prescription medicines. ? Eat foods that are high in fiber, such as fresh fruits and vegetables, whole grains,  and beans. ? Limit foods that are high in fat and processed sugars, such as fried and sweet foods. General instructions  Ask your health care provider when you will need an appointment to get your sutures or staples removed.  Keep all follow-up visits as told by your health care provider. This is important. Contact a health care provider if:  You have more redness, swelling, or pain around your incisions.  You have more fluid or blood coming from the incisions.  Your incisions feel warm to the  touch.  You have pus or a bad smell coming from your incisions or your dressing.  You have a fever.  You have an incision that breaks open (edges not staying together) after sutures or staples have been removed. Get help right away if:  You develop a rash.  You have chest pain or difficulty breathing.  You have pain or swelling in your legs.  You feel light-headed or you faint.  Your abdomen swells (becomes distended).  You have nausea or vomiting.  You have blood in your stool (feces). This information is not intended to replace advice given to you by your health care provider. Make sure you discuss any questions you have with your health care provider. Document Released: 06/08/2005 Document Revised: 08/08/2018 Document Reviewed: 08/20/2016 Elsevier Interactive Patient Education  2019 [employer].

## 2019-10-10 NOTE — Progress Notes (Signed)
Patricia Callahan to be D/C'd Home per MD order.  Discussed prescriptions and follow up appointments with the patient. Prescriptions given to patient, medication list explained in detail. Pt verbalized understanding.  Allergies as of 10/10/2019   No Known Allergies     Medication List    STOP taking these medications   metroNIDAZOLE 500 MG tablet Commonly known as: FLAGYL   neomycin 500 MG tablet Commonly known as: MYCIFRADIN     TAKE these medications   acetaminophen 325 MG tablet Commonly known as: Tylenol Take 2 tablets (650 mg total) by mouth every 8 (eight) hours as needed for mild pain. Notes to patient: As needed   amoxicillin-clavulanate 875-125 MG tablet Commonly known as: Augmentin Take 1 tablet by mouth 2 (two) times daily for 7 days. Notes to patient: Before bed 10/10/19   calcium carbonate 500 MG chewable tablet Commonly known as: TUMS - dosed in mg elemental calcium Chew 1 tablet by mouth as needed for indigestion or heartburn. Notes to patient: As needed   estradiol 1 MG tablet Commonly known as: ESTRACE Take 1 tablet (1 mg total) by mouth daily. What changed: when to take this Notes to patient: Morning 10/11/19   HYDROcodone-acetaminophen 5-325 MG tablet Commonly known as: Norco Take 1 tablet by mouth every 6 (six) hours as needed for up to 6 doses for moderate pain. Notes to patient: As needed   ibuprofen 800 MG tablet Commonly known as: ADVIL Take 1 tablet (800 mg total) by mouth every 8 (eight) hours as needed for mild pain or moderate pain. Notes to patient: As needed   multivitamin with minerals Tabs tablet Take 1 tablet by mouth daily. Notes to patient: Morning 10/11/19   norethindrone 5 MG tablet Commonly known as: AYGESTIN Take 0.5 tablets (2.5 mg total) by mouth daily. What changed: when to take this Notes to patient: Morning 10/11/19   simvastatin 20 MG tablet Commonly known as: ZOCOR Take 20 mg by mouth at bedtime. Notes to patient: Before  bed 10/10/19   vitamin B-12 1000 MCG tablet Commonly known as: CYANOCOBALAMIN Take 1,000 mcg by mouth daily. Notes to patient: Morning 10/11/19   vitamin C 1000 MG tablet Take 1,000 mg by mouth daily. Notes to patient: Morning 10/11/19   Vitamin D3 50 MCG (2000 UT) Tabs Take 2,000 Units by mouth daily. Notes to patient: Morning 10/11/19       Vitals:   10/09/19 2014 10/10/19 0452  BP: (!) 153/79 125/66  Pulse: 72 72  Resp: 20 20  Temp: 97.9 F (36.6 C) 98.3 F (36.8 C)  SpO2: 95% 97%    Skin clean, dry and intact without evidence of skin break down, no evidence of skin tears noted. IV catheter discontinued intact. Site without signs and symptoms of complications. Dressing and pressure applied. Pt denies pain at this time. No complaints noted.  An After Visit Summary was printed and given to the patient. Patient escorted via [place], and D/C home via private auto.  Fuller Mandril, RN

## 2019-10-10 NOTE — Discharge Summary (Signed)
Physician Discharge Summary  Patient ID: Patricia Callahan MRN: [id number] DOB/AGE: 02-Sep-1961 58 y.o.  Admit date: 10/08/2019 Discharge date: 10/10/2019  Admission Diagnoses: terminal ileum stricture  Discharge Diagnoses:  Same as above  Discharged Condition: good  Hospital Course: admitted for above after elective lap hand assisted ileocectomy.  Please see op note for details.  Recovered well post op.  Small incontinence but manageable at time of d/c.  Pain controlled, tolerating diet  Consults: None  Discharge Exam: Blood pressure 125/66, pulse 72, temperature 98.3 F (36.8 C), temperature source Oral, resp. rate 20, height 5\' 4"  (1.626 m), weight 79.9 kg, SpO2 97 %. General appearance: alert, cooperative and no distress GI: soft, non-tender; bowel sounds normal; no masses,  no organomegaly and staple line and port sited c/d/i  Disposition:  Discharge disposition: 01-Home or Self Care       Discharge Instructions    Discharge patient   Complete by: As directed    Discharge disposition: 01-Home or Self Care   Discharge patient date: 10/10/2019     Allergies as of 10/10/2019   No Known Allergies     Medication List    STOP taking these medications   metroNIDAZOLE 500 MG tablet Commonly known as: FLAGYL   neomycin 500 MG tablet Commonly known as: MYCIFRADIN     TAKE these medications   acetaminophen 325 MG tablet Commonly known as: Tylenol Take 2 tablets (650 mg total) by mouth every 8 (eight) hours as needed for mild pain.   amoxicillin-clavulanate 875-125 MG tablet Commonly known as: Augmentin Take 1 tablet by mouth 2 (two) times daily for 7 days.   calcium carbonate 500 MG chewable tablet Commonly known as: TUMS - dosed in mg elemental calcium Chew 1 tablet by mouth as needed for indigestion or heartburn.   estradiol 1 MG tablet Commonly known as: ESTRACE Take 1 tablet (1 mg total) by mouth daily. What changed: when to take this    HYDROcodone-acetaminophen 5-325 MG tablet Commonly known as: Norco Take 1 tablet by mouth every 6 (six) hours as needed for up to 6 doses for moderate pain.   ibuprofen 800 MG tablet Commonly known as: ADVIL Take 1 tablet (800 mg total) by mouth every 8 (eight) hours as needed for mild pain or moderate pain.   multivitamin with minerals Tabs tablet Take 1 tablet by mouth daily.   norethindrone 5 MG tablet Commonly known as: AYGESTIN Take 0.5 tablets (2.5 mg total) by mouth daily. What changed: when to take this   simvastatin 20 MG tablet Commonly known as: ZOCOR Take 20 mg by mouth at bedtime.   vitamin B-12 1000 MCG tablet Commonly known as: CYANOCOBALAMIN Take 1,000 mcg by mouth daily.   vitamin C 1000 MG tablet Take 1,000 mg by mouth daily.   Vitamin D3 50 MCG (2000 UT) Tabs Take 2,000 Units by mouth daily.      Follow-up Information    Patricia Munce, DO Follow up in 7 day(s).   Specialty: Surgery Why:  possible staple removal post procedure Contact information: [address] [place] [address] [phone]            Total time spent arranging discharge was >2min. Signed: Benjamine Sprague 10/10/2019, 6:52 AM

## 2019-10-12 LAB — SURGICAL PATHOLOGY

## 2019-10-21 DIAGNOSIS — K5000 Crohn's disease of small intestine without complications: Secondary | ICD-10-CM | POA: Insufficient documentation

## 2019-10-22 ENCOUNTER — Other Ambulatory Visit: Payer: Self-pay

## 2019-10-22 ENCOUNTER — Encounter (INDEPENDENT_AMBULATORY_CARE_PROVIDER_SITE_OTHER): Payer: Self-pay

## 2019-10-22 ENCOUNTER — Ambulatory Visit (INDEPENDENT_AMBULATORY_CARE_PROVIDER_SITE_OTHER): Payer: BC Managed Care – PPO | Admitting: Gastroenterology

## 2019-10-22 ENCOUNTER — Encounter: Payer: Self-pay | Admitting: Gastroenterology

## 2019-10-22 VITALS — BP 159/90 | HR 86 | Temp 98.1°F | Wt 171.0 lb

## 2019-10-22 DIAGNOSIS — K50119 Crohn's disease of large intestine with unspecified complications: Secondary | ICD-10-CM

## 2019-10-23 NOTE — Progress Notes (Signed)
[name], MD [address]  [place], [place] [address]  Main: [phone]  Fax: [phone]   Primary Care Physician: Maryland Pink, MD   Chief Complaint  Patient presents with  . small bowel obstruction    HPI: Patricia Callahan is a 58 y.o. female who underwent surgery on November 5 with Dr. 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  Denies any abdominal pain, diarrhea, constipation.  Reports good appetite and 1 soft bowel movement daily.  Even prior to the surgery patient did not have any abdominal symptoms, with no abdominal pain, diarrhea, blood in stool, weight loss or any other symptoms of Crohn's disease.  Pathology from the surgery reported chronic active colitis/enteritis with ulceration, fissures and transmural lymphoid aggregates.  No definite well-formed granulomas identified.  However pathology report states histology features are compatible with Crohn's colitis.  Previous history:  previously seen for abnormal CT showing abnormal appearance of small bowel loops without any symptoms.  On initial visit patient chose to undergo CTE instead of colonoscopy.  CTE showed no significant change in mild to moderate enteritis involving the distal ileum and terminal ileum "highly suspicious for Crohn's disease".  CRP was normal, ESR was ordered but canceled by lab.  Patient subsequently underwent colonoscopy with Dr. Vicente Males showed ileocecal valve stricture and it could not be intubated.  5 mm colon polyp was also seen.  Polyp showed tubular adenoma.  Past Medical History:  Diagnosis Date  . GERD (gastroesophageal reflux disease)    OCC TUMS PRN  . Hypercholesteremia   . Vasomotor symptoms due to menopause     No smoking, occassional alcohol, no drugs  Current Outpatient Medications  Medication Sig Dispense Refill  . acetaminophen (TYLENOL) 325 MG tablet Take 2 tablets (650 mg total) by mouth every 8 (eight) hours  as needed for mild pain. 40 tablet 0  . Ascorbic Acid (VITAMIN C) 1000 MG tablet Take 1,000 mg by mouth daily.    . calcium carbonate (TUMS - DOSED IN MG ELEMENTAL CALCIUM) 500 MG chewable tablet Chew 1 tablet by mouth as needed for indigestion or heartburn.    . Cholecalciferol (VITAMIN D3) 50 MCG (2000 UT) TABS Take 2,000 Units by mouth daily.    Marland Kitchen estradiol (ESTRACE) 1 MG tablet Take 1 tablet (1 mg total) by mouth daily. (Patient taking differently: Take 1 mg by mouth at bedtime. ) 90 tablet 3  . HYDROcodone-acetaminophen (NORCO) 5-325 MG tablet Take 1 tablet by mouth every 6 (six) hours as needed for up to 6 doses for moderate pain. 6 tablet 0  . ibuprofen (ADVIL) 800 MG tablet Take 1 tablet (800 mg total) by mouth every 8 (eight) hours as needed for mild pain or moderate pain. 30 tablet 0  . Multiple Vitamin (MULTIVITAMIN WITH MINERALS) TABS tablet Take 1 tablet by mouth daily.    Marland Kitchen neomycin (MYCIFRADIN) 500 MG tablet Take 2 tablets at 2pm, 3pm, and 10pm the day before surgery    . norethindrone (AYGESTIN) 5 MG tablet Take 0.5 tablets (2.5 mg total) by mouth daily. (Patient taking differently: Take 2.5 mg by mouth every evening. ) 45 tablet 3  . simvastatin (ZOCOR) 20 MG tablet Take 20 mg by mouth at bedtime.     . vitamin B-12 (CYANOCOBALAMIN) 1000 MCG tablet Take 1,000 mcg by mouth daily.    . metroNIDAZOLE (FLAGYL) 500 MG tablet Take 2 tablets at 2pm, 3pm, and 10pm the day before  surgery     No current facility-administered medications for this visit.     Allergies as of 10/22/2019  . (No Known Allergies)    ROS:  General: Negative for anorexia, weight loss, fever, chills, fatigue, weakness. ENT: Negative for hoarseness, difficulty swallowing , nasal congestion. CV: Negative for chest pain, angina, palpitations, dyspnea on exertion, peripheral edema.  Respiratory: Negative for dyspnea at rest, dyspnea on exertion, cough, sputum, wheezing.  GI: See history of present illness. GU:   Negative for dysuria, hematuria, urinary incontinence, urinary frequency, nocturnal urination.  Endo: Negative for unusual weight change.    Physical Examination:   BP (!) 159/90 (BP Location: Left Arm, Patient Position: Sitting, Cuff Size: Normal)   Pulse 86   Temp 98.1 F (36.7 C) (Oral)   Wt 171 lb (77.6 kg)   BMI 29.35 kg/m   General: Well-nourished, well-developed in no acute distress.  Eyes: No icterus. Conjunctivae pink. Mouth: Oropharyngeal mucosa moist and pink , no lesions erythema or exudate. Neck: Supple, Trachea midline Abdomen: Bowel sounds are normal, nontender, nondistended, no hepatosplenomegaly or masses, no abdominal bruits or hernia , no rebound or guarding.   Extremities: No lower extremity edema. No clubbing or deformities. Neuro: Alert and oriented x 3.  Grossly intact. Skin: Warm and dry, no jaundice.   Psych: Alert and cooperative, normal mood and affect.   Labs: CMP     Component Value Date/Time   NA 142 10/22/2019 1610   K 4.2 10/22/2019 1610   CL 102 10/22/2019 1610   CO2 22 10/22/2019 1610   GLUCOSE 78 10/22/2019 1610   GLUCOSE 84 10/10/2019 0548   BUN 15 10/22/2019 1610   CREATININE 0.88 10/22/2019 1610   CALCIUM 10.7 (H) 10/22/2019 1610   PROT 7.6 10/22/2019 1610   ALBUMIN 4.1 10/22/2019 1610   AST 19 10/22/2019 1610   ALT 14 10/22/2019 1610   ALKPHOS 65 10/22/2019 1610   BILITOT 0.2 10/22/2019 1610   GFRNONAA 73 10/22/2019 1610   GFRAA 84 10/22/2019 1610   Lab Results  Component Value Date   WBC 12.9 (H) 10/22/2019   HGB 14.4 10/22/2019   HCT 42.5 10/22/2019   MCV 89 10/22/2019   PLT 413 10/22/2019    Imaging Studies: No results found.  Assessment and Plan:   Patricia Callahan is a 58 y.o. y/o female here for follow-up post ileocecectomy for ileal stricture noted on imaging and colonoscopy, with Dr. Vicente Males who did the procedure unable to traverse into the terminal ileum due to the tight stricture  With pathology confirming Crohn's  disease from the surgery, and Crohn's disease also involving the colon as per the pathology report, it would be beneficial to start patient on treatment to prevent further recurrence  We discussed options of anti-TNF agents, versus Entyvio or Stelara.  After discussing risks and benefits, patient would prefer to proceed with Entyvio and does not want to inject herself and is comfortable with IV iron infusions  Risks of the medications explained in detail including risk of infection, malignancy and others  Will obtain baseline labs and submit for [facility] approval  Patient otherwise has always been asymptomatic as far as Crohn's disease is concerned and remains asymptomatic.  Recovering well post surgery and has been evaluated by the surgeon  No extraintestinal manifestations present  Dr [name]

## 2019-10-26 ENCOUNTER — Other Ambulatory Visit: Payer: Self-pay | Admitting: Gastroenterology

## 2019-10-26 ENCOUNTER — Telehealth: Payer: Self-pay

## 2019-10-26 NOTE — Telephone Encounter (Signed)
Patient Patricia Callahan quantiferon test came back indeterminate. Dr. Bonna Gains states that she will need to call PCP office to get the Patricia Callahan skin test. Called patient and informed patient of this information and she states she will call her PCP office.

## 2019-10-27 LAB — COMPREHENSIVE METABOLIC PANEL
ALT: 14 IU/L (ref 0–32)
AST: 19 IU/L (ref 0–40)
Albumin/Globulin Ratio: 1.2 (ref 1.2–2.2)
Albumin: 4.1 g/dL (ref 3.8–4.9)
Alkaline Phosphatase: 65 IU/L (ref 39–117)
BUN/Creatinine Ratio: 17 (ref 9–23)
BUN: 15 mg/dL (ref 6–24)
Bilirubin Total: 0.2 mg/dL (ref 0.0–1.2)
CO2: 22 mmol/L (ref 20–29)
Calcium: 10.7 mg/dL — ABNORMAL HIGH (ref 8.7–10.2)
Chloride: 102 mmol/L (ref 96–106)
Creatinine, Ser: 0.88 mg/dL (ref 0.57–1.00)
GFR calc Af Amer: 84 mL/min/{1.73_m2} (ref >59)
GFR calc non Af Amer: 73 mL/min/{1.73_m2} (ref >59)
Globulin, Total: 3.5 g/dL (ref 1.5–4.5)
Glucose: 78 mg/dL (ref 65–99)
Potassium: 4.2 mmol/L (ref 3.5–5.2)
Sodium: 142 mmol/L (ref 134–144)
Total Protein: 7.6 g/dL (ref 6.0–8.5)

## 2019-10-27 LAB — QUANTIFERON-TB GOLD PLUS
QuantiFERON Mitogen Value: 0.02 IU/mL
QuantiFERON Nil Value: 0.02 IU/mL
QuantiFERON TB1 Ag Value: 0.02 IU/mL
QuantiFERON TB2 Ag Value: 0.02 IU/mL
QuantiFERON-TB Gold Plus: UNDETERMINED — AB

## 2019-10-27 LAB — CBC
Hematocrit: 42.5 % (ref 34.0–46.6)
Hemoglobin: 14.4 g/dL (ref 11.1–15.9)
MCH: 30.2 pg (ref 26.6–33.0)
MCHC: 33.9 g/dL (ref 31.5–35.7)
MCV: 89 fL (ref 79–97)
Platelets: 413 10*3/uL (ref 150–450)
RBC: 4.77 x10E6/uL (ref 3.77–5.28)
RDW: 12.3 % (ref 11.7–15.4)
WBC: 12.9 10*3/uL — ABNORMAL HIGH (ref 3.4–10.8)

## 2019-10-27 LAB — THIOPURINE METHYLTRANSFERASE (TPMT), RBC: TPMT Activity:: 20.6 Units/mL RBC

## 2019-10-27 LAB — HCV COMMENT:

## 2019-10-27 LAB — HEPATITIS C ANTIBODY (REFLEX): HCV Ab: <0.1 s/co ratio (ref 0.0–0.9)

## 2019-10-27 LAB — HEPATITIS B SURFACE ANTIGEN: Hepatitis B Surface Ag: NEGATIVE

## 2019-10-27 LAB — HEPATITIS A ANTIBODY, TOTAL: hep A Total Ab: NEGATIVE

## 2019-10-27 LAB — HEPATITIS B CORE ANTIBODY, TOTAL: Hep B Core Total Ab: NEGATIVE

## 2019-10-27 LAB — HEPATITIS B SURFACE ANTIBODY,QUALITATIVE: Hep B Surface Ab, Qual: NONREACTIVE

## 2019-10-28 ENCOUNTER — Telehealth: Payer: Self-pay

## 2019-10-28 ENCOUNTER — Ambulatory Visit: Payer: BC Managed Care – PPO | Admitting: Gastroenterology

## 2019-10-28 NOTE — Telephone Encounter (Signed)
-----   Message from Virgel Manifold, MD sent at 10/28/2019 10:02 AM EST ----- Caryl Pina please let the patient know, she needs TB testing with PPD.  Please call her primary care doctor office to help facilitate setting this up.  I would also recommend hepatitis A and B vaccination.  If she can get it out at our clinic, please set her up for the series.

## 2019-10-28 NOTE — Telephone Encounter (Signed)
Patient verbalized understanding,. She will wait to get vaccines till next office visit. Patient will get PPD at PCP office

## 2019-10-31 LAB — CALPROTECTIN, FECAL: Calprotectin, Fecal: 641 ug/g — ABNORMAL HIGH (ref 0–120)

## 2019-11-02 ENCOUNTER — Telehealth: Payer: Self-pay

## 2019-11-02 NOTE — Telephone Encounter (Signed)
Sharrie Rothman With [employer] sent a e-mail stating that they need a letter stating what patient has tried and failed and the reason why patient needs the Entyvio.

## 2019-11-03 NOTE — Telephone Encounter (Signed)
Put in a letter and printed the letter. Faxed this to [place]. Patient called and states she called her PCP and is going to get TB skin test.

## 2019-11-12 ENCOUNTER — Telehealth: Payer: Self-pay

## 2019-11-12 NOTE — Telephone Encounter (Signed)
Insurance company denied patient medication for [employer]. Put denial letter on desk. Please advised

## 2019-11-12 NOTE — Telephone Encounter (Signed)
Called and left a message for call back  

## 2019-11-13 NOTE — Telephone Encounter (Signed)
Left a detail message explaining to patient that they denied for her to get the Entyvio. Explain that we were going to try to get the stelara approved. Faxed forms to [place] with [employer]

## 2019-11-13 NOTE — Telephone Encounter (Signed)
Patient called back and states she is not taking the Stelara medication because she was told that medication could cause cancer and she is not getting cancer. She states she is going to call her insurance to see if she can get the [facility] approved. Patient states she is fine and having no symptoms and states she really dont want to take any medication. She states she is going to try to get this medication approved. Informed Sharrie Rothman to disregard the referral for the Stelara medication.  She says if this does not get approved then we will need to find another option if she needs a medication

## 2019-11-16 NOTE — Telephone Encounter (Signed)
MD may contact Medical director at [phone] Ext [phone] to discuss denial and additional clinical info may be faxed to [phone].  The LMN created by the MD was submitted with the clinical packet and PA was still denied.  Pt must t/f corticosteroids or immunomodulators per medical policy. This is the appeal information from [employer]

## 2019-11-19 ENCOUNTER — Ambulatory Visit (INDEPENDENT_AMBULATORY_CARE_PROVIDER_SITE_OTHER): Payer: BC Managed Care – PPO | Admitting: Gastroenterology

## 2019-11-19 ENCOUNTER — Encounter: Payer: Self-pay | Admitting: Gastroenterology

## 2019-11-19 ENCOUNTER — Other Ambulatory Visit: Payer: Self-pay

## 2019-11-19 DIAGNOSIS — K50012 Crohn's disease of small intestine with intestinal obstruction: Secondary | ICD-10-CM | POA: Diagnosis not present

## 2019-11-19 MED ORDER — MERCAPTOPURINE 50 MG PO TABS: 50.0000 mg | ORAL_TABLET | Freq: Every day | ORAL | 0 refills | Status: AC

## 2019-11-19 MED ORDER — METRONIDAZOLE 500 MG PO TABS: 500.0000 mg | ORAL_TABLET | Freq: Two times a day (BID) | ORAL | 0 refills | Status: AC

## 2019-11-19 NOTE — Progress Notes (Signed)
[name], MD [address]  [place], [place] [address]  Main: [phone]  Fax: [phone]   Primary Care Physician: [place] Pink, MD  Virtual Visit via Telephone Note  I connected with patient on 11/19/19 at  2:30 PM EST by telephone and verified that I am speaking with the correct person using two identifiers.   I discussed the limitations, risks, security and privacy concerns of performing an evaluation and management service by telephone and the availability of in person appointments. I also discussed with the patient that there may be a patient responsible charge related to this service. The patient expressed understanding and agreed to proceed.  Location of Patient: Home Location of Provider: Home Persons involved: Patient and provider only during the visit (nursing staff and front desk staff was involved in communicating with the patient prior to the appointment, reviewing medications and checking them in)   History of Present Illness: Chief Complaint  Patient presents with  . Crohn's Disease    to discuss medication      HPI: Patricia Callahan is a 58 y.o. female with history of stricturing Crohn's disease here for follow-up. The patient denies abdominal or flank pain, anorexia, nausea or vomiting, dysphagia, change in bowel habits or black or bloody stools or weight loss.  Underwent surgery November 5 with Dr. Lysle Pearl, ileocecectomy, due to terminal ileum stricture seen on imaging, and unable to be intubated on colonoscopy.  Pathology showed chronic active colitis, enteritis with ulceration, fissures and transmural lymphoid aggregates.  No definite well-formed granulomas.  Pathology report states histology features are compatible with Crohn's colitis.  Patient was otherwise asymptomatic from Crohn's disease perspective with no abdominal pain, diarrhea, blood in stool, weight loss even when she had the tight stricture.  Pertinent history: previously  seen for abnormal CT showing abnormal appearance of small bowel loops without any symptoms. On initial visit patient chose to undergo CTE instead of colonoscopy. CTEshowed no significant change in mild to moderate enteritis involving the distal ileum and terminal ileum "highly suspicious for Crohn's disease". CRP was normal, ESR was ordered but canceled by lab. Patient subsequently underwent colonoscopy with Dr. Vicente Males showed ileocecal valve stricture and it could not be intubated. 5 mm colon polyp was also seen. Polyp showed tubular adenoma.  Current Outpatient Medications  Medication Sig Dispense Refill  . Ascorbic Acid (VITAMIN C) 1000 MG tablet Take 1,000 mg by mouth daily.    . calcium carbonate (TUMS - DOSED IN MG ELEMENTAL CALCIUM) 500 MG chewable tablet Chew 1 tablet by mouth as needed for indigestion or heartburn.    . Cholecalciferol (VITAMIN D3) 50 MCG (2000 UT) TABS Take 2,000 Units by mouth daily.    Marland Kitchen estradiol (ESTRACE) 1 MG tablet Take 1 tablet (1 mg total) by mouth daily. (Patient taking differently: Take 1 mg by mouth at bedtime. ) 90 tablet 3  . HYDROcodone-acetaminophen (NORCO) 5-325 MG tablet Take 1 tablet by mouth every 6 (six) hours as needed for up to 6 doses for moderate pain. 6 tablet 0  . ibuprofen (ADVIL) 800 MG tablet Take 1 tablet (800 mg total) by mouth every 8 (eight) hours as needed for mild pain or moderate pain. 30 tablet 0  . Multiple Vitamin (MULTIVITAMIN WITH MINERALS) TABS tablet Take 1 tablet by mouth daily.    Marland Kitchen neomycin (MYCIFRADIN) 500 MG tablet Take 2 tablets at 2pm, 3pm, and 10pm the day before surgery    . norethindrone (AYGESTIN) 5 MG  tablet Take 0.5 tablets (2.5 mg total) by mouth daily. (Patient taking differently: Take 2.5 mg by mouth every evening. ) 45 tablet 3  . simvastatin (ZOCOR) 20 MG tablet Take 20 mg by mouth at bedtime.     . vitamin B-12 (CYANOCOBALAMIN) 1000 MCG tablet Take 1,000 mcg by mouth daily.    . mercaptopurine (PURINETHOL) 50  MG tablet Take 1 tablet (50 mg total) by mouth daily. Give on an empty stomach 1 hour before or 2 hours after meals. Caution: Chemotherapy. 90 tablet 0  . metroNIDAZOLE (FLAGYL) 500 MG tablet Take 1 tablet (500 mg total) by mouth 2 (two) times daily. 180 tablet 0   No current facility-administered medications for this visit.    Allergies as of 11/19/2019  . (No Known Allergies)    Review of Systems:    All systems reviewed and negative except where noted in HPI.   Observations/Objective:  Labs: CMP     Component Value Date/Time   NA 142 10/22/2019 1610   K 4.2 10/22/2019 1610   CL 102 10/22/2019 1610   CO2 22 10/22/2019 1610   GLUCOSE 78 10/22/2019 1610   GLUCOSE 84 10/10/2019 0548   BUN 15 10/22/2019 1610   CREATININE 0.88 10/22/2019 1610   CALCIUM 10.7 (H) 10/22/2019 1610   PROT 7.6 10/22/2019 1610   ALBUMIN 4.1 10/22/2019 1610   AST 19 10/22/2019 1610   ALT 14 10/22/2019 1610   ALKPHOS 65 10/22/2019 1610   BILITOT 0.2 10/22/2019 1610   GFRNONAA 73 10/22/2019 1610   GFRAA 84 10/22/2019 1610   Lab Results  Component Value Date   WBC 12.9 (H) 10/22/2019   HGB 14.4 10/22/2019   HCT 42.5 10/22/2019   MCV 89 10/22/2019   PLT 413 10/22/2019    Imaging Studies: No results found.  Assessment and Plan:   Patricia Callahan is a 58 y.o. y/o female here for follow-up of Crohn's disease, stricturing disease, now status post ileocecectomy for ileal stricture, with pathology consistent with Crohn's disease  Assessment and Plan: To prevent recurrence, medical therapy is recommended, especially with past showing fistulizing disease and also showing colitis on the pathology besides the ileitis as well  We discussed treatment options in detail and AGA guidelines (https://www.gastrojournal.org/article/S0016-5085(16)35285-4/pdf) recommend anti-TNF or thiopurine in postsurgical Crohn's disease patients to prevent disease recurrence  We discussed different options with the patient,  including Humira, Remicade, 6-MP, azathioprine and discussed the adverse effects including rare adverse effects of lymphoma, malignancy, infection, PML associated with these medications.  Patient states she has been reading a lot about Crohn's disease and these medications and would prefer to go on the oral medication/diet appearance.  Is okay with proceeding with metronidazole for 3 months as per guidelines and recommendations as well.  States this will fit better with her lifestyle and having to do injections or infusions.  TPMT level was normal in November 2020  Patient informed she will need labs while on these meds "complete blood count [CBC], serum aminotransferases, total bilirubin, and amylase) is typically performed weekly for the first four weeks, then every two weeks for weeks five through 12, and at least every three months thereafter. In addition, laboratory testing is performed in two weeks following dose escalation."  Labs ordered and 6-MP and metronidazole sent to pharmacy  No extraintestinal manifestations present at this time  Follow Up Instructions: 3 to 4 weeks   I discussed the assessment and treatment plan with the patient. The patient was provided an  opportunity to ask questions and all were answered. The patient agreed with the plan and demonstrated an understanding of the instructions.   The patient was advised to call back or seek an in-person evaluation if the symptoms worsen or if the condition fails to improve as anticipated.  I provided 18 minutes of non-face-to-face time during this encounter. Additional time was spent in reviewing patient's chart, placing orders etc.   Virgel Manifold, MD  Speech recognition software was used to dictate this note.

## 2019-11-19 NOTE — Patient Instructions (Addendum)
First lab one week after starting medication. Continue weekly for the first four weeks, then every two weeks for weeks five through 12, then every three months thereafter   Below is some generic information about 6-MP. It is used in Crohns disease like we discussed but the below information talks about its other uses as well.   You will also need labs weekly for the first 4 weeks after starting the medication to ensure it is not causing any side effects.   Mercaptopurine, 6-MP oral suspension What is this medicine? MERCAPTOPURINE, 6-MP (mer kap toe PYOOR een) is a chemotherapy drug. It interferes with the growth of cancer cells and can reduce immune system activity. It is used to treat certain types of acute leukemia. This medicine may be used for other purposes; ask your health care provider or pharmacist if you have questions. COMMON BRAND NAME(S): PURIXAN What should I tell my health care provider before I take this medicine? They need to know if you have any of these conditions:  kidney disease  liver disease  low blood counts like low white cell, platelet, or red cell counts  nucleotide diphosphatase (NUDT15) deficiency  recently received or scheduled to receive a vaccine  thiopurine methyltransferase (TPMT) deficiency  an unusual or allergic reaction to mercaptopurine, other medicines, foods, dyes, or preservatives  pregnant or trying to get pregnant  breast-feeding How should I use this medicine? Take this medicine by mouth. Follow the directions on the prescription label. Shake well before using. Use the special measuring syringe that comes with this medication to measure your dose. Household spoons are not accurate. Refer to the Instructions for Use that come with your medication packaging. Take your medicine at regular intervals. Do not take it more often than directed. Do not stop taking except on your doctor's advice. Talk to your pediatrician regarding the use of this  medicine in children. While this drug may be prescribed for selected conditions, precautions do apply. Overdosage: If you think you have taken too much of this medicine contact a poison control center or emergency room at once. NOTE: This medicine is only for you. Do not share this medicine with others. What if I miss a dose? If you miss a dose, take it as soon as you can. If it is almost time for your next dose, take only that dose. Do not take double or extra doses. What may interact with this medicine? Do not take this medicine with any of the following medications:  febuxostat This medicine may interact with the following medications:  allopurinol  certain medicines that treat or prevent blood clots like warfarin  certain medicines that treat ulcerative colitis like balsalazide, olsalazine, mesalamine, or sulfasalazine  live virus vaccines  trimethoprim-sulfamethoxazole This list may not describe all possible interactions. Give your health care provider a list of all the medicines, herbs, non-prescription drugs, or dietary supplements you use. Also tell them if you smoke, drink alcohol, or use illegal drugs. Some items may interact with your medicine. What should I watch for while using this medicine? This drug may make you feel generally unwell. This is not uncommon, as chemotherapy can affect healthy cells as well as cancer cells. Report any side effects. Continue your course of treatment even though you feel ill unless your doctor tells you to stop. This medicine may increase your risk to bruise or bleed. Call your doctor or health care professional if you notice any unusual bleeding. Call your doctor or health care professional for  advice if you get a fever, chills or sore throat, or other symptoms of a cold or flu. Do not treat yourself. This drug decreases your body's ability to fight infections. Try to avoid being around people who are sick. This medicine can make you more  sensitive to the sun. Keep out of the sun. If you cannot avoid being in the sun, wear protective clothing and use sunscreen. Do not use sun lamps or tanning beds/booths. Talk to your doctor about your risk of cancer. You may be more at risk for certain types of cancers if you take this medicine. Do not become pregnant while taking this medicine or for 6 months after stopping it. Women should inform their doctor if they wish to become pregnant or think they might be pregnant. Men should not father a child while taking this medicine and for 3 months after stopping it. There is a potential for serious side effects to an unborn child. Talk to your health care professional or pharmacist for more information. Do not breast-feed an infant while taking this medicine or for 1 week after stopping it. This medicine may interfere with the ability to have a child. Talk with your doctor or health care professional if you are concerned about your fertility. What side effects may I notice from receiving this medicine? Side effects that you should report to your doctor or health care professional as soon as possible:  allergic reactions like skin rash, itching or hives, swelling of the face, lips, or tongue  breathing problems  low blood counts - this medicine may decrease the number of white blood cells, red blood cells and platelets. You may be at increased risk for infections and bleeding  signs of decreased platelets or bleeding - bruising, pinpoint red spots on the skin, black, tarry stools, blood in the urine  signs of decreased red blood cells - unusually weak or tired, feeling faint or lightheaded, falls  signs of infection - fever or chills, cough, sore throat, pain or difficulty passing urine  signs and symptoms of liver injury like dark yellow or brown urine; general ill feeling or flu-like symptoms; light-colored stools; loss of appetite; nausea; right upper belly pain; unusually weak or tired;  yellowing of the eyes or skin  signs and symptoms of low blood sugar such as feeling anxious; confusion; dizziness; increased hunger; unusually weak or tired; sweating; shakiness; cold; irritable; headache; blurred vision; fast heartbeat; loss of consciousness  sore throat  swelling of the stomach Side effects that usually do not require medical attention (report to your doctor or health care professional if they continue or are bothersome):  diarrhea  hair loss  mouth sores  vomiting This list may not describe all possible side effects. Call your doctor for medical advice about side effects. You may report side effects to FDA at 1-800-FDA-1088. Where should I keep my medicine? Keep out of the reach of children. Store between 15 and 25 degrees C (59 and 77 degrees F). Keep container tightly closed. Throw away any unused medicine after 8 weeks. NOTE: This sheet is a summary. It may not cover all possible information. If you have questions about this medicine, talk to your doctor, pharmacist, or health care provider.  2020 Elsevier/Gold Standard (2019-03-25 18:48:29)

## 2019-12-01 LAB — CBC
Hematocrit: 41.8 % (ref 34.0–46.6)
Hemoglobin: 14.1 g/dL (ref 11.1–15.9)
MCH: 31 pg (ref 26.6–33.0)
MCHC: 33.7 g/dL (ref 31.5–35.7)
MCV: 92 fL (ref 79–97)
Platelets: 506 10*3/uL — ABNORMAL HIGH (ref 150–450)
RBC: 4.55 x10E6/uL (ref 3.77–5.28)
RDW: 12.3 % (ref 11.7–15.4)
WBC: 13.5 10*3/uL — ABNORMAL HIGH (ref 3.4–10.8)

## 2019-12-01 LAB — HEPATIC FUNCTION PANEL
ALT: 13 IU/L (ref 0–32)
AST: 21 IU/L (ref 0–40)
Albumin: 4 g/dL (ref 3.8–4.9)
Alkaline Phosphatase: 53 IU/L (ref 39–117)
Bilirubin Total: <0.2 mg/dL (ref 0.0–1.2)
Bilirubin, Direct: 0.08 mg/dL (ref 0.00–0.40)
Total Protein: 7.2 g/dL (ref 6.0–8.5)

## 2019-12-01 LAB — AMYLASE: Amylase: 46 U/L (ref 31–110)

## 2019-12-16 ENCOUNTER — Encounter: Payer: Self-pay | Admitting: Gastroenterology

## 2019-12-16 ENCOUNTER — Telehealth: Payer: Self-pay

## 2019-12-16 ENCOUNTER — Ambulatory Visit (INDEPENDENT_AMBULATORY_CARE_PROVIDER_SITE_OTHER): Payer: BC Managed Care – PPO | Admitting: Gastroenterology

## 2019-12-16 ENCOUNTER — Other Ambulatory Visit: Payer: Self-pay

## 2019-12-16 DIAGNOSIS — Z9049 Acquired absence of other specified parts of digestive tract: Secondary | ICD-10-CM | POA: Diagnosis not present

## 2019-12-16 DIAGNOSIS — K5090 Crohn's disease, unspecified, without complications: Secondary | ICD-10-CM

## 2019-12-16 DIAGNOSIS — K50812 Crohn's disease of both small and large intestine with intestinal obstruction: Secondary | ICD-10-CM

## 2019-12-16 NOTE — Progress Notes (Signed)
Patricia Antigua, MD [address]  [place]  [place], [place] [address]  Main: [phone]  Fax: [phone]   Primary Care Physician: Patricia Pink, MD  Virtual Visit via Telephone Note  I connected with patient on 12/16/19 at  1:30 PM EST by telephone and verified that I am speaking with the correct person using two identifiers.   I discussed the limitations, risks, security and privacy concerns of performing an evaluation and management service by telephone and the availability of in person appointments. I also discussed with the patient that there may be a patient responsible charge related to this service. The patient expressed understanding and agreed to proceed.  Location of Patient: Home Location of Provider: Home Persons involved: Patient and provider only during the visit (nursing staff and front desk staff was involved in communicating with the patient prior to the appointment, reviewing medications and checking them in)   History of Present Illness: Chief complaint: Crohn's disease  HPI: Patricia Callahan is a 59 y.o. female history of stricturing Crohn's disease being seen for follow-up.  Underwent surgical resection of terminal ileal stricture, with ileocecectomy by Dr. Lysle Pearl on November 5th, and started on combination of 6-MP and metronidazole on 11/22/2019.  Patient reports doing well.  Reports 1-3 bowel movements a day, that are formed.  No blood in stool.  No fevers or chills.  No changes in appetite, no weight loss.  No abdominal pain.  Even prior to her diagnosis of Crohn's disease, which was largely based on imaging and colonoscopy findings, patient did not have typical symptoms of Crohn's disease such as abdominal pain, diarrhea, weight loss, blood in stool etc.  Previous history: previously seen for abnormal CT incidentally showing abnormal appearance of small bowel loops without any symptoms. On initial visit patient chose to undergo CTE instead of  colonoscopy. CTEshowed no significant change in mild to moderate enteritis involving the distal ileum and terminal ileum "highly suspicious for Crohn's disease". CRP was normal, ESR was ordered but canceled by lab. Patient subsequently underwent colonoscopy with Dr. Vicente Males showed ileocecal valve stricture and it could not be intubated. 5 mm colon polyp was also seen. Polyp showed tubular adenoma.  Underwent surgery November 5 with Dr. Lysle Pearl, ileocecectomy, due to terminal ileum stricture seen on imaging, and unable to be intubated on colonoscopy.  Pathology showed chronic active colitis, enteritis with ulceration, fissures and transmural lymphoid aggregates.  No definite well-formed granulomas.  Pathology report states histology features are compatible with Crohn's colitis.  Current Outpatient Medications  Medication Sig Dispense Refill  . amLODipine (NORVASC) 2.5 MG tablet Take 2.5 mg by mouth daily.    . Ascorbic Acid (VITAMIN C) 1000 MG tablet Take 1,000 mg by mouth daily.    . calcium carbonate (TUMS - DOSED IN MG ELEMENTAL CALCIUM) 500 MG chewable tablet Chew 1 tablet by mouth as needed for indigestion or heartburn.    . Cholecalciferol (VITAMIN D3) 50 MCG (2000 UT) TABS Take 2,000 Units by mouth daily.    Marland Kitchen estradiol (ESTRACE) 1 MG tablet Take 1 tablet (1 mg total) by mouth daily. (Patient taking differently: Take 1 mg by mouth at bedtime. ) 90 tablet 3  . HYDROcodone-acetaminophen (NORCO) 5-325 MG tablet Take 1 tablet by mouth every 6 (six) hours as needed for up to 6 doses for moderate pain. 6 tablet 0  . mercaptopurine (PURINETHOL) 50 MG tablet Take 1 tablet (50 mg total) by mouth daily. Give on an empty stomach 1 hour  before or 2 hours after meals. Caution: Chemotherapy. 90 tablet 0  . metroNIDAZOLE (FLAGYL) 500 MG tablet Take 1 tablet (500 mg total) by mouth 2 (two) times daily. 180 tablet 0  . Multiple Vitamin (MULTIVITAMIN WITH MINERALS) TABS tablet Take 1 tablet by mouth daily.    Marland Kitchen  neomycin (MYCIFRADIN) 500 MG tablet Take 2 tablets at 2pm, 3pm, and 10pm the day before surgery    . norethindrone (AYGESTIN) 5 MG tablet Take 0.5 tablets (2.5 mg total) by mouth daily. (Patient taking differently: Take 2.5 mg by mouth every evening. ) 45 tablet 3  . simvastatin (ZOCOR) 20 MG tablet Take 20 mg by mouth at bedtime.     . vitamin B-12 (CYANOCOBALAMIN) 1000 MCG tablet Take 1,000 mcg by mouth daily.     No current facility-administered medications for this visit.    Allergies as of 12/16/2019  . (No Known Allergies)    Review of Systems:    All systems reviewed and negative except where noted in HPI.   Observations/Objective:  Labs: CMP     Component Value Date/Time   NA 142 10/22/2019 1610   K 4.2 10/22/2019 1610   CL 102 10/22/2019 1610   CO2 22 10/22/2019 1610   GLUCOSE 78 10/22/2019 1610   GLUCOSE 84 10/10/2019 0548   BUN 15 10/22/2019 1610   CREATININE 0.88 10/22/2019 1610   CALCIUM 10.7 (H) 10/22/2019 1610   PROT 7.2 11/30/2019 1419   ALBUMIN 4.0 11/30/2019 1419   AST 21 11/30/2019 1419   ALT 13 11/30/2019 1419   ALKPHOS 53 11/30/2019 1419   BILITOT <0.2 11/30/2019 1419   GFRNONAA 73 10/22/2019 1610   GFRAA 84 10/22/2019 1610   Lab Results  Component Value Date   WBC 13.5 (H) 11/30/2019   HGB 14.1 11/30/2019   HCT 41.8 11/30/2019   MCV 92 11/30/2019   PLT 506 (H) 11/30/2019    Imaging Studies: No results found.  Assessment and Plan:   Patricia Callahan is a 59 y.o. y/o female here for follow-up of Crohn's disease, status post ileocecectomy due to terminal ileum stricture, doing well on 6-MP and metronidazole  Assessment and Plan: Continue 6-MP and metronidazole as per AGA guidelines for postsurgical Crohn's disease patients  Lab work 1 week after starting her medications was reassuring  Repeat lab work now, patient is on week 4 of medications.  After this, lab work every 2 weeks until week 12, and then every 3 months after that  Alarm  symptoms discussed with which to call us, including fever chills, abdominal pain, nausea vomiting, diarrhea or blood in stool and others.  Follow Up Instructions: 6 to 8 weeks   I discussed the assessment and treatment plan with the patient. The patient was provided an opportunity to ask questions and all were answered. The patient agreed with the plan and demonstrated an understanding of the instructions.   The patient was advised to call back or seek an in-person evaluation if the symptoms worsen or if the condition fails to improve as anticipated.  I provided 12 minutes of non-face-to-face time during this encounter. Additional time was spent in reviewing patient's chart, placing orders etc.   Virgel Manifold, MD  Speech recognition software was used to dictate this note.

## 2019-12-16 NOTE — Telephone Encounter (Signed)
Tried to call patient to make patient a follow up appointment with Dr. Vicente Males per Dr. Bonna Gains request in 8 weeks. Patient states she does not see why she needs a follow up appointment. Patient states " our office only cares about making money from insurance companies. I have a 5000 dollar deductible that I have to meet before insurance will pay anything. Hell will roll over before I will make a pointless follow up appointment." Patient states she will call back if she decides to make a follow up appointment.  Advised patient that Dr. Bonna Gains wanted her to get lab work done this week and the latest next week. Then have it done every 2 weeks. Patient states she will come to our office to have this done next Thursday.

## 2019-12-16 NOTE — Addendum Note (Signed)
Addended by: Ulyess Blossom L on: 12/16/2019 03:10 PM   Modules accepted: Orders

## 2020-02-10 ENCOUNTER — Other Ambulatory Visit: Payer: Self-pay | Admitting: Gastroenterology

## 2020-03-29 ENCOUNTER — Other Ambulatory Visit: Payer: Self-pay

## 2020-04-19 NOTE — Progress Notes (Signed)
04/20/20 3:35 PM   Patricia Callahan 03/08/1961 [id number]  Referring provider: Maryland Pink, MD [address] [place],  [place] [address] Chief Complaint  Patient presents with  . Hematuria    HPI: Patricia Callahan is a 59 y.o. F who presents today for the evaluation and management of rUTIs.   She has a personal hx of microscopic hematuria for which she was evaluated for. She had a cystoscopy in August which was unremarkable and since that time she has had suspicious urine in 10/2019, 11/2019, 02/2020, and 03/2020. Associated cultures negative except for 02/2020 as shown below.   She was evaluated by gastroenterology and more information was needed to be diagnosed w/ Crohn's disease. She underwent a colectomy in November 2020 w/ no subsequent imaging following surgery. She was taken off her meds and is going to be followed up w/ another colonoscopy for further evaluation.  She was put on abx 500 mg which was too strong for her. She reports of UTI after finishing course of abx.   Currently asymptomatic. She reports of drinking more water which relieved her symptoms. She also notes of taking probiotics. She has not tried cranberry tablets.   She is curently on oral Estrace.   PVR 40 mL.   +Urine culture  03/02/20 indicative of E. Coli   PMH: Past Medical History:  Diagnosis Date  . Crohn's disease ([place])   . GERD (gastroesophageal reflux disease)    OCC TUMS PRN  . Hypercholesteremia   . Hypertension   . Vasomotor symptoms due to menopause     Surgical History: Past Surgical History:  Procedure Laterality Date  . COLONOSCOPY  2015   normal, was advised to return in 10 years  . COLONOSCOPY WITH PROPOFOL N/A 09/10/2019   Procedure: COLONOSCOPY WITH PROPOFOL;  Surgeon: Jonathon Bellows, MD;  Location: [facility] ENDOSCOPY;  Service: Gastroenterology;  Laterality: N/A;  . COLOSTOMY REVISION  10/08/2019   Procedure: COLON RESECTION RIGHT;  Surgeon: Benjamine Sprague, DO;  Location:  [facility] ORS;  Service: General;;  . LAPAROSCOPIC RIGHT COLECTOMY Right 10/08/2019   Procedure: ATTEMPTED LAPAROSCOPIC RIGHT COLECTOMY;  Surgeon: Benjamine Sprague, DO;  Location: [facility] ORS;  Service: General;  Laterality: Right;    Home Medications:  Allergies as of 04/20/2020   No Known Allergies     Medication List       Accurate as of Apr 20, 2020 11:59 PM. If you have any questions, ask your nurse or doctor.        STOP taking these medications   HYDROcodone-acetaminophen 5-325 MG tablet Commonly known as: Norco Stopped by: Hollice Espy, MD   neomycin 500 MG tablet Commonly known as: MYCIFRADIN Stopped by: Hollice Espy, MD     TAKE these medications   amLODipine 2.5 MG tablet Commonly known as: NORVASC Take 2.5 mg by mouth daily.   calcium carbonate 1250 (500 Ca) MG chewable tablet Commonly known as: OS-CAL Chew by mouth.   calcium carbonate 500 MG chewable tablet Commonly known as: TUMS - dosed in mg elemental calcium Chew 1 tablet by mouth as needed for indigestion or heartburn.   estradiol 1 MG tablet Commonly known as: ESTRACE Take 1 tablet (1 mg total) by mouth daily. What changed: when to take this   metoprolol succinate 25 MG 24 hr tablet Commonly known as: TOPROL-XL Take by mouth.   multivitamin with minerals Tabs tablet Take 1 tablet by mouth daily.   norethindrone 5 MG tablet Commonly known as:  AYGESTIN Take 0.5 tablets (2.5 mg total) by mouth daily.   simvastatin 20 MG tablet Commonly known as: ZOCOR Take 20 mg by mouth at bedtime.   vitamin B-12 1000 MCG tablet Commonly known as: CYANOCOBALAMIN Take 1,000 mcg by mouth daily.   vitamin C 1000 MG tablet Take 1,000 mg by mouth daily.   Vitamin D3 50 MCG (2000 UT) Tabs Take 2,000 Units by mouth daily.       Allergies: No Known Allergies  Family History: Family History  Problem Relation Age of Onset  . Diabetes Mother   . Hypertension Mother   . Hyperlipidemia Mother   .  Hypertension Father   . Hyperlipidemia Father   . Dementia Father   . Prostate cancer Father   . Heart disease Father   . Breast cancer Neg Hx     Social History:  reports that she has never smoked. She has never used smokeless tobacco. She reports current alcohol use. She reports that she does not use drugs.   Physical Exam: BP (!) 153/84   Pulse 80   Ht 5\' 4"  (1.626 m)   Wt 170 lb (77.1 kg)   BMI 29.18 kg/m   Constitutional:  Alert and oriented, No acute distress. HEENT: [place] AT, moist mucus membranes.  Trachea midline, no masses. Cardiovascular: No clubbing, cyanosis, or edema. Respiratory: Normal respiratory effort, no increased work of breathing. Skin: No rashes, bruises or suspicious lesions. Neurologic: Grossly intact, no focal deficits, moving all 4 extremities. Psychiatric: Normal mood and affect.  Laboratory Data:  Urinalysis 3-10 RBC/hpf  Pertinent Imaging: Results for orders placed or performed in visit on 04/20/20  Microscopic Examination   URINE  Result Value Ref Range   WBC, UA 0-5 0 - 5 /hpf   RBC 3-10 (A) 0 - 2 /hpf   Epithelial Cells (non renal) 0-10 0 - 10 /hpf   Bacteria, UA None seen None seen/Few  Urinalysis, Complete  Result Value Ref Range   Specific Gravity, UA 1.025 1.005 - 1.030   pH, UA 5.5 5.0 - 7.5   Color, UA Yellow Yellow   Appearance Ur Hazy (A) Clear   Leukocytes,UA Negative Negative   Protein,UA Negative Negative/Trace   Glucose, UA Negative Negative   Ketones, UA Negative Negative   RBC, UA Trace (A) Negative   Bilirubin, UA Negative Negative   Urobilinogen, Ur 0.2 0.2 - 1.0 mg/dL   Nitrite, UA Negative Negative   Microscopic Examination See below:   BLADDER SCAN AMB NON-IMAGING  Result Value Ref Range   Scan Result 40 ML    Assessment & Plan:    1. rUTI  Recurrent episodes likely due to surgeries and recent medications  Asymptomatic today Adequate emptying of bladder UA revealed microscopic blood  Counseled pt on  prevention techniques including probiotics, cranberry tablets and drinking water   Advised to return to our office specifically if she has any UTI type symptoms for further treatment which may include topical estrogen or suppressive abx   2. Microscopic hematuria  Persistent, work up last year unremarkable for GU issues Will f/u in 1 year for [place] [address] [place], [place] [address] [phone]  I, Lucas Mallow, am acting as a scribe for Dr. Hollice Espy,  I have reviewed the above documentation for accuracy and completeness, and I agree with the above.   Hollice Espy, MD  I spent 30 total minutes on the day of the encounter including pre-visit  review of the medical record, face-to-face time with the patient, and post visit ordering of labs/imaging/tests.

## 2020-04-20 ENCOUNTER — Ambulatory Visit (INDEPENDENT_AMBULATORY_CARE_PROVIDER_SITE_OTHER): Payer: BC Managed Care – PPO | Admitting: Urology

## 2020-04-20 ENCOUNTER — Other Ambulatory Visit: Payer: Self-pay | Admitting: Podiatry

## 2020-04-20 ENCOUNTER — Other Ambulatory Visit: Payer: Self-pay

## 2020-04-20 ENCOUNTER — Encounter: Payer: Self-pay | Admitting: Podiatry

## 2020-04-20 VITALS — BP 153/84 | HR 80 | Ht 64.0 in | Wt 170.0 lb

## 2020-04-20 DIAGNOSIS — N390 Urinary tract infection, site not specified: Secondary | ICD-10-CM

## 2020-04-20 DIAGNOSIS — R3129 Other microscopic hematuria: Secondary | ICD-10-CM | POA: Diagnosis not present

## 2020-04-20 LAB — BLADDER SCAN AMB NON-IMAGING: Scan Result: 40

## 2020-04-21 LAB — URINALYSIS, COMPLETE
Bilirubin, UA: NEGATIVE
Glucose, UA: NEGATIVE
Ketones, UA: NEGATIVE
Leukocytes,UA: NEGATIVE
Nitrite, UA: NEGATIVE
Protein,UA: NEGATIVE
Specific Gravity, UA: 1.025 (ref 1.005–1.030)
Urobilinogen, Ur: 0.2 mg/dL (ref 0.2–1.0)
pH, UA: 5.5 (ref 5.0–7.5)

## 2020-04-21 LAB — MICROSCOPIC EXAMINATION: Bacteria, UA: NONE SEEN

## 2020-04-26 ENCOUNTER — Other Ambulatory Visit: Payer: Self-pay

## 2020-04-26 ENCOUNTER — Other Ambulatory Visit
Admission: RE | Admit: 2020-04-26 | Discharge: 2020-04-26 | Disposition: A | Discharge status: Home / Self Care | Payer: BC Managed Care – PPO | Source: Ambulatory Visit | Attending: Podiatry | Admitting: Podiatry

## 2020-04-26 DIAGNOSIS — Z01812 Encounter for preprocedural laboratory examination: Secondary | ICD-10-CM | POA: Insufficient documentation

## 2020-04-26 DIAGNOSIS — Z20822 Contact with and (suspected) exposure to covid-19: Secondary | ICD-10-CM | POA: Insufficient documentation

## 2020-04-26 LAB — SARS CORONAVIRUS 2 (TAT 6-24 HRS): SARS Coronavirus 2: NEGATIVE

## 2020-04-27 NOTE — Discharge Instructions (Signed)
[facility]  POST OPERATIVE INSTRUCTIONS FOR DR. TROXLER, DR. FOWLER, AND DR. [facility] PODIATRY DEPART[facility]   1. Take your medication as prescribed.  Pain medication should be taken only as needed.  2. Keep the dressing clean, dry and intact.  3. Keep your foot elevated above the heart level for the first 48 hours.  4. Walking to the bathroom and brief periods of walking are acceptable, unless we have instructed you to be non-weight bearing.  5. Always wear your post-op shoe when walking.  Always use your crutches if you are to be non-weight bearing.  6. Do not take a shower. Baths are permissible as long as the foot is kept out of the water.   7. Every hour you are awake:  - Bend your knee 15 times. - Flex foot 15 times - Massage calf 15 times  8. Call [facility] ([phone]) if any of the following problems occur: - You develop a temperature or fever. - The bandage becomes saturated with blood. - Medication does not stop your pain. - Injury of the foot occurs. - Any symptoms of infection including redness, odor, or red streaks running from wound.   General Anesthesia, Adult, Care After This sheet gives you information about how to care for yourself after your procedure. Your health care provider may also give you more specific instructions. If you have problems or questions, contact your health care provider. What can I expect after the procedure? After the procedure, the following side effects are common:  Pain or discomfort at the IV site.  Nausea.  Vomiting.  Sore throat.  Trouble concentrating.  Feeling cold or chills.  Weak or tired.  Sleepiness and fatigue.  Soreness and body aches. These side effects can affect parts of the body that were not involved in surgery. Follow these instructions at home:  For at least 24 hours after the procedure:  Have a responsible adult stay with you. It is  important to have someone help care for you until you are awake and alert.  Rest as needed.  Do not: ? Participate in activities in which you could fall or become injured. ? Drive. ? Use heavy machinery. ? Drink alcohol. ? Take sleeping pills or medicines that cause drowsiness. ? Make important decisions or sign legal documents. ? Take care of children on your own. Eating and drinking  Follow any instructions from your health care provider about eating or drinking restrictions.  When you feel hungry, start by eating small amounts of foods that are soft and easy to digest (bland), such as toast. Gradually return to your regular diet.  Drink enough fluid to keep your urine pale yellow.  If you vomit, rehydrate by drinking water, juice, or clear broth. General instructions  If you have sleep apnea, surgery and certain medicines can increase your risk for breathing problems. Follow instructions from your health care provider about wearing your sleep device: ? Anytime you are sleeping, including during daytime naps. ? While taking prescription pain medicines, sleeping medicines, or medicines that make you drowsy.  Return to your normal activities as told by your health care provider. Ask your health care provider what activities are safe for you.  Take over-the-counter and prescription medicines only as told by your health care provider.  If you smoke, do not smoke without supervision.  Keep all follow-up visits as told by your health care provider. This is important. Contact a health care provider if:    You have nausea or vomiting that does not get better with medicine.  You cannot eat or drink without vomiting.  You have pain that does not get better with medicine.  You are unable to pass urine.  You develop a skin rash.  You have a fever.  You have redness around your IV site that gets worse. Get help right away if:  You have difficulty breathing.  You have chest  pain.  You have blood in your urine or stool, or you vomit blood. Summary  After the procedure, it is common to have a sore throat or nausea. It is also common to feel tired.  Have a responsible adult stay with you for the first 24 hours after general anesthesia. It is important to have someone help care for you until you are awake and alert.  When you feel hungry, start by eating small amounts of foods that are soft and easy to digest (bland), such as toast. Gradually return to your regular diet.  Drink enough fluid to keep your urine pale yellow.  Return to your normal activities as told by your health care provider. Ask your health care provider what activities are safe for you. This information is not intended to replace advice given to you by your health care provider. Make sure you discuss any questions you have with your health care provider. Document Revised: 11/22/2017 Document Reviewed: 07/05/2017 Elsevier Patient Education  2020 [employer]  

## 2020-04-28 ENCOUNTER — Ambulatory Visit
Admission: RE | Admit: 2020-04-28 | Discharge: 2020-04-28 | Disposition: A | Discharge status: Home / Self Care | Payer: BC Managed Care – PPO | Attending: Podiatry | Admitting: Podiatry

## 2020-04-28 ENCOUNTER — Encounter: Payer: Self-pay | Admitting: Podiatry

## 2020-04-28 ENCOUNTER — Ambulatory Visit: Payer: BC Managed Care – PPO | Admitting: Anesthesiology

## 2020-04-28 ENCOUNTER — Encounter
Admission: RE | Disposition: A | Discharge status: Home / Self Care | Payer: Self-pay | Source: Home / Self Care | Attending: Podiatry

## 2020-04-28 ENCOUNTER — Other Ambulatory Visit: Payer: Self-pay

## 2020-04-28 DIAGNOSIS — E785 Hyperlipidemia, unspecified: Secondary | ICD-10-CM | POA: Diagnosis not present

## 2020-04-28 DIAGNOSIS — I10 Essential (primary) hypertension: Secondary | ICD-10-CM | POA: Insufficient documentation

## 2020-04-28 DIAGNOSIS — M722 Plantar fascial fibromatosis: Secondary | ICD-10-CM | POA: Diagnosis not present

## 2020-04-28 DIAGNOSIS — G8929 Other chronic pain: Secondary | ICD-10-CM | POA: Insufficient documentation

## 2020-04-28 HISTORY — DX: Crohn's disease, unspecified, without complications: K50.90

## 2020-04-28 HISTORY — PX: PLANTAR FASCIA RELEASE: SHX2239

## 2020-04-28 HISTORY — DX: Essential (primary) hypertension: I10

## 2020-04-28 SURGERY — RELEASE, FASCIA, PLANTAR
Anesthesia: Regional | Site: Foot | Laterality: Left

## 2020-04-28 MED ORDER — FENTANYL CITRATE (PF) 100 MCG/2ML IJ SOLN: 25.0000 ug | INTRAMUSCULAR | Status: DC | PRN

## 2020-04-28 MED ORDER — POVIDONE-IODINE 7.5 % EX SOLN: Freq: Once | CUTANEOUS | Status: DC

## 2020-04-28 MED ORDER — PROPOFOL 10 MG/ML IV BOLUS
INTRAVENOUS | Status: DC | PRN
  Administered 2020-04-28: 120 mg via INTRAVENOUS

## 2020-04-28 MED ORDER — FENTANYL CITRATE (PF) 100 MCG/2ML IJ SOLN
INTRAMUSCULAR | Status: DC | PRN
  Administered 2020-04-28: 50 ug via INTRAVENOUS

## 2020-04-28 MED ORDER — ONDANSETRON HCL 4 MG/2ML IJ SOLN
INTRAMUSCULAR | Status: DC | PRN
  Administered 2020-04-28: 4 mg via INTRAVENOUS

## 2020-04-28 MED ORDER — LIDOCAINE HCL (CARDIAC) PF 100 MG/5ML IV SOSY
PREFILLED_SYRINGE | INTRAVENOUS | Status: DC | PRN
  Administered 2020-04-28: 40 mg via INTRATRACHEAL

## 2020-04-28 MED ORDER — MIDAZOLAM HCL 5 MG/5ML IJ SOLN
INTRAMUSCULAR | Status: DC | PRN
Start: 2020-04-28 — End: 2020-04-28
  Administered 2020-04-28: 2 mg via INTRAVENOUS

## 2020-04-28 MED ORDER — CEFAZOLIN SODIUM-DEXTROSE 2-4 GM/100ML-% IV SOLN
2.0000 g | INTRAVENOUS | Status: AC
  Administered 2020-04-28: 2 g via INTRAVENOUS

## 2020-04-28 MED ORDER — OXYCODONE HCL 5 MG/5ML PO SOLN: 5.0000 mg | Freq: Once | ORAL | Status: AC | PRN

## 2020-04-28 MED ORDER — HYDROCODONE-ACETAMINOPHEN 7.5-325 MG PO TABS: 1.0000 | ORAL_TABLET | Freq: Four times a day (QID) | ORAL | 0 refills | Status: DC | PRN

## 2020-04-28 MED ORDER — GLYCOPYRROLATE 0.2 MG/ML IJ SOLN
INTRAMUSCULAR | Status: DC | PRN
  Administered 2020-04-28: .1 mg via INTRAVENOUS

## 2020-04-28 MED ORDER — OXYCODONE HCL 5 MG PO TABS
5.0000 mg | ORAL_TABLET | Freq: Once | ORAL | Status: AC | PRN
  Administered 2020-04-28: 5 mg via ORAL

## 2020-04-28 MED ORDER — BUPIVACAINE LIPOSOME 1.3 % IJ SUSP
INTRAMUSCULAR | Status: DC | PRN
  Administered 2020-04-28: 266 mg

## 2020-04-28 MED ORDER — DEXAMETHASONE SODIUM PHOSPHATE 4 MG/ML IJ SOLN
INTRAMUSCULAR | Status: DC | PRN
  Administered 2020-04-28: 4 mg via INTRAVENOUS

## 2020-04-28 MED ORDER — BUPIVACAINE HCL (PF) 0.5 % IJ SOLN
INTRAMUSCULAR | Status: DC | PRN
  Administered 2020-04-28: 20 mL

## 2020-04-28 MED ORDER — LACTATED RINGERS IV SOLN: INTRAVENOUS | Status: DC

## 2020-04-28 SURGICAL SUPPLY — 32 items
APPLICATOR COTTON TIP 6 STRL (MISCELLANEOUS) ×5 IMPLANT
APPLICATOR COTTON TIP 6IN STRL (MISCELLANEOUS) ×15
BNDG ESMARK 4X12 TAN STRL LF (GAUZE/BANDAGES/DRESSINGS) ×3 IMPLANT
BNDG GAUZE 4.5X4.1 6PLY STRL (MISCELLANEOUS) ×3 IMPLANT
BNDG STRETCH 4X75 STRL LF (GAUZE/BANDAGES/DRESSINGS) ×3 IMPLANT
CANISTER SUCT 1200ML W/VALVE (MISCELLANEOUS) ×3 IMPLANT
COVER LIGHT HANDLE UNIVERSAL (MISCELLANEOUS) ×6 IMPLANT
CUFF TOURN SGL QUICK 18X4 (TOURNIQUET CUFF) IMPLANT
DURAPREP 26ML APPLICATOR (WOUND CARE) ×3 IMPLANT
ELECT REM PT RETURN 9FT ADLT (ELECTROSURGICAL) ×3
ELECTRODE REM PT RTRN 9FT ADLT (ELECTROSURGICAL) ×1 IMPLANT
GAUZE SPONGE 4X4 12PLY STRL (GAUZE/BANDAGES/DRESSINGS) IMPLANT
GAUZE XEROFORM 1X8 LF (GAUZE/BANDAGES/DRESSINGS) ×3 IMPLANT
GLOVE BIO SURGEON STRL SZ8 (GLOVE) ×9 IMPLANT
GOWN STRL REUS W/ TWL LRG LVL3 (GOWN DISPOSABLE) ×1 IMPLANT
GOWN STRL REUS W/ TWL XL LVL3 (GOWN DISPOSABLE) ×1 IMPLANT
GOWN STRL REUS W/TWL LRG LVL3 (GOWN DISPOSABLE) ×2
GOWN STRL REUS W/TWL XL LVL3 (GOWN DISPOSABLE) ×2
IV NS 250ML (IV SOLUTION) ×2
IV NS 250ML BAXH (IV SOLUTION) ×1 IMPLANT
K-WIRE DBL END TROCAR 6X.062 (WIRE) ×3
KIT TURNOVER KIT A (KITS) ×3 IMPLANT
KWIRE DBL END TROCAR 6X.062 (WIRE) ×1 IMPLANT
NS IRRIG 500ML POUR BTL (IV SOLUTION) ×3 IMPLANT
PACK EXTREMITY ARMC (MISCELLANEOUS) ×3 IMPLANT
PENCIL SMOKE EVACUATOR (MISCELLANEOUS) IMPLANT
SPLINT CAST 1 STEP 4X30 (MISCELLANEOUS) IMPLANT
STOCKINETTE STRL 6IN 960660 (GAUZE/BANDAGES/DRESSINGS) ×3 IMPLANT
SUT ETHILON 4-0 (SUTURE) ×2
SUT ETHILON 4-0 FS2 18XMFL BLK (SUTURE) ×1
SUTURE ETHLN 4-0 FS2 18XMF BLK (SUTURE) ×1 IMPLANT
WAND TOPAZ MICRO DEBRIDER (MISCELLANEOUS) ×3 IMPLANT

## 2020-04-28 NOTE — Anesthesia Preprocedure Evaluation (Signed)
Anesthesia Evaluation  Patient identified by MRN, date of birth, ID band Patient awake    Reviewed: Allergy & Precautions, H&P , NPO status , Patient's Chart, lab work & pertinent test results  History of Anesthesia Complications Negative for: history of anesthetic complications  Airway Mallampati: II  TM Distance: >3 FB Neck ROM: full    Dental no notable dental hx.    Pulmonary neg pulmonary ROS,    Pulmonary exam normal        Cardiovascular hypertension, On Medications Normal cardiovascular exam Rhythm:regular Rate:Normal     Neuro/Psych negative neurological ROS     GI/Hepatic Neg liver ROS, Medicated,  Endo/Other  negative endocrine ROS  Renal/GU negative Renal ROS  negative genitourinary   Musculoskeletal   Abdominal   Peds  Hematology negative hematology ROS (+)   Anesthesia Other Findings   Reproductive/Obstetrics                             Anesthesia Physical Anesthesia Plan  ASA: II  Anesthesia Plan: General LMA and Regional   Post-op Pain Management: GA combined w/ Regional for post-op pain   Induction:   PONV Risk Score and Plan: 3 and Ondansetron and Midazolam  Airway Management Planned:   Additional Equipment:   Intra-op Plan:   Post-operative Plan:   Informed Consent: I have reviewed the patients History and Physical, chart, labs and discussed the procedure including the risks, benefits and alternatives for the proposed anesthesia with the patient or authorized representative who has indicated his/her understanding and acceptance.       Plan Discussed with:   Anesthesia Plan Comments:         Anesthesia Quick Evaluation

## 2020-04-28 NOTE — H&P (Signed)
H and P has been reviewed and no changes are noted.  

## 2020-04-28 NOTE — Transfer of Care (Signed)
Immediate Anesthesia Transfer of Care Note  Patient: Patricia Callahan  Procedure(s) Performed: ENDOSCOPIC PLANTAR FASCIA RELEASE WITH TOPAZ LEFT (Left Foot)  Patient Location: PACU  Anesthesia Type: General LMA, Regional  Level of Consciousness: awake, alert  and patient cooperative  Airway and Oxygen Therapy: Patient Spontanous Breathing and Patient connected to supplemental oxygen  Post-op Assessment: Post-op Vital signs reviewed, Patient's Cardiovascular Status Stable, Respiratory Function Stable, Patent Airway and No signs of Nausea or vomiting  Post-op Vital Signs: Reviewed and stable  Complications: No apparent anesthesia complications

## 2020-04-28 NOTE — Anesthesia Postprocedure Evaluation (Signed)
Anesthesia Post Note  Patient: Anaiz L [place]  Procedure(s) Performed: ENDOSCOPIC PLANTAR FASCIA RELEASE WITH TOPAZ LEFT (Left Foot)     Patient location during evaluation: PACU Anesthesia Type: Regional Level of consciousness: awake and alert Pain management: pain level controlled Vital Signs Assessment: post-procedure vital signs reviewed and stable Respiratory status: spontaneous breathing Cardiovascular status: stable Anesthetic complications: no    Minh Jasper, III,  Macyn Remmert D

## 2020-04-28 NOTE — Anesthesia Procedure Notes (Signed)
Anesthesia Regional Block: Popliteal block   Pre-Anesthetic Checklist: ,, timeout performed, Correct Patient, Correct Site, Correct Laterality, Correct Procedure, Correct Position, site marked, Risks and benefits discussed,  Surgical consent,  Pre-op evaluation,  At surgeon's request and post-op pain management  Laterality: Left  Prep: chloraprep       Needles:  Injection technique: Single-shot  Needle Type: Echogenic Needle     Needle Length: 9cm  Needle Gauge: 21     Additional Needles:   Procedures:,,,, ultrasound used (permanent image in chart),,,,  Narrative:  Start time: 04/28/2020 8:29 AM End time: 04/28/2020 8:38 AM Injection made incrementally with aspirations every 5 mL.  Performed by: Personally  Anesthesiologist: Elgie Collard, MD  Additional Notes: Functioning IV was confirmed and monitors applied. Ultrasound guidance: relevant anatomy identified, needle position confirmed, local anesthetic spread visualized around nerve(s)., vascular puncture avoided.  Image printed for medical record.  Negative aspiration and no paresthesias; incremental administration of local anesthetic. The patient tolerated the procedure well. Vitals signes recorded in RN notes.

## 2020-04-28 NOTE — Op Note (Signed)
Operative note   Surgeon: Dr. Albertine Patricia, DPM.    Assistant: None    Preop diagnosis: Chronic plantar fasciitis left    Postop diagnosis: Same    Procedure:   1.  Endoscopic partial plantar fascial release to the medial one third of the proximal ligament left foot   2.  Topaz fasciotomy plantar left foot       EBL: Less than 5 cc    Anesthesia:general delivered by the anesthesia team along with a popliteal block using Exparel    Hemostasis: Ankle tourniquet at 225 mils mercury pressure for 20 minutes    Specimen: None    Complications: None    Operative indications: Chronic pain and plantar fasciitis unresponsive to conservative care    Procedure:  Patient was brought into the OR and placed on the operating table in thesupine position. After anesthesia was obtained theleft lower extremity was prepped and draped in usual sterile fashion.  Operative Report: This time attention was directed to the medial plantar and lateral left heel.  A 22-gauge needle was used to discern the distal border of the medial calcaneal tubercle once this was established and the area was measured 1-1/2 cm distal to that and a linear incision was made from dorsal to plantar approximately 1 cm.  This point a combination of hemostats a blunt probe were used to free up the plantar aspect of the plantar fascial ligament and a trocar and cannula was introduced from medial to lateral.  Small incision was made laterally pushed through along with the cannula.  Trocar was then removed and the cannula slot was up against the plantar fascial ligament.  This time the area was cleaned with sterile cotton tip applicators.  The camera was introduced.  Excellent visualization of the plantar fascial ligament was noted.  This point a combination of the triangular blade and a push blade was used to release the medial one third of the ligament and muscle was identifiable deep to this.  This was also checked at this time  superficially and the ligament was palpated and seem to be more relaxed.  The cannula was then removed and the area was copiously irrigated with sterile saline.  The medial lateral incisions were closed with simple erupted 4-0 nylon sutures.  At this time the plantar heel was identified a grid pattern had been marked with a marking pen on the plantar aspect of the ligament concentrating along with the medial central lateral portions of the plantar fascial insertion.  This point the Topaz wand was inserted into these areas and 2-second blast of high-frequency radiowave was used to ablate the thickened ligament in this region.  Once is accomplished through the 16 holes that had been achieved the process was discontinued.  This time a sterile compressive dressing consisting of Xeroform gauze 4 x 4's conformer Kling was placed across the left foot and leg.  The patient has a cam walking boot utilized.    Patient tolerated the procedure and anesthesia well.  Was transported from the OR to the PACU with all vital signs stable and vascular status intact. To be discharged per routine protocol.  Will follow up in approximately 1 week in the outpatient clinic.

## 2020-04-28 NOTE — Anesthesia Procedure Notes (Addendum)
Procedure Name: LMA Insertion Date/Time: 04/28/2020 10:09 AM Performed by: Mayme Genta, CRNA Pre-anesthesia Checklist: Patient identified, Emergency Drugs available, Suction available, Timeout performed and Patient being monitored Patient Re-evaluated:Patient Re-evaluated prior to induction Oxygen Delivery Method: Circle system utilized Preoxygenation: Pre-oxygenation with 100% oxygen Induction Type: IV induction LMA: LMA inserted LMA Size: 4.0 Number of attempts: 1 Placement Confirmation: positive ETCO2 and breath sounds checked- equal and bilateral Tube secured with: Tape

## 2020-04-29 ENCOUNTER — Encounter: Payer: Self-pay | Admitting: *Deleted

## 2020-05-12 ENCOUNTER — Other Ambulatory Visit: Payer: Self-pay | Admitting: Family Medicine

## 2020-05-12 DIAGNOSIS — Z1231 Encounter for screening mammogram for malignant neoplasm of breast: Secondary | ICD-10-CM

## 2020-05-16 ENCOUNTER — Telehealth: Payer: Self-pay | Admitting: Radiology

## 2020-05-16 NOTE — Telephone Encounter (Signed)
Patient reports urgency with only a small amount of urine each time she urinates and has a history of frequent urinary tract infections.  Requests to leave a urine sample. Per Thomas Hoff, PA, advised patient she needs to be seen in the office. Patient states she wants to wait a few days to see if the symptoms improve. Advised patient that if she is having urinary tract infection symptoms she needs to be seen. Patient states she will call back if symptoms do not improve.

## 2020-05-24 ENCOUNTER — Other Ambulatory Visit: Payer: Self-pay | Admitting: Certified Nurse Midwife

## 2020-05-24 DIAGNOSIS — N951 Menopausal and female climacteric states: Secondary | ICD-10-CM

## 2020-06-01 NOTE — Progress Notes (Signed)
Gynecology Annual Exam  PCP: Maryland Pink, MD  Chief Complaint:  Chief Complaint  Patient presents with  . Gynecologic Exam  Needs refill of HT  History of Present Illness: Patricia Callahan is a 59 y.o. G26 postmenopausal female who presents for her annual exam. The patient complains of some mild intermittent vulvar itching, many times around the panty line. She has been applying Desitin with some relief. 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 She reports frequent symptoms of a UTI, that tend to resolve with increased water intake. Also desires a refill Analpram for her hemorrhoids.  Her vasomotor symptoms are controlled on her hormone therapy. She has had no spotting since her last annual. 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 She also had left foot surgery for plantar fasciitis in May. She was also diagnosed with hypertension and is now on Metaprolol. She has received her Covid 19 vaccine.   Her past medical history is remarkable for hyperlipidemia and microscopic hematuria. She had a work up last year for microscopic hematuria, that was negative for any urological problems but did discover the bowel abnormality.    Last pap: on 03/28/2018 was NIL/ negative.  Last mammogram: 06/15/2019  was negative. There is no family history of breast or ovarian cancer. She had a colonoscopy in 2020 as part of a work up for the ileocecal stricture.    The patient has regular exercise: no, but has a physical job.  She does get adequate calcium in her diet and takes vitamin D3 supplements. She does not drink or use tobacco products. Her last lipid panel was 10/17/2019 that was normal. .    Review of Systems:  Review of Systems  Constitutional: Negative for chills, fever and weight loss.  HENT: Negative for congestion, sinus pain and sore throat.   Eyes: Negative for blurred vision and pain.  Respiratory: Negative for hemoptysis, shortness of breath and wheezing.   Cardiovascular: Negative for chest pain, palpitations and leg swelling.  Gastrointestinal: Negative for abdominal pain, blood in stool, diarrhea, heartburn, nausea and vomiting.  Genitourinary: Positive for frequency. Negative for dysuria, hematuria and urgency.  Musculoskeletal: Negative for back pain, joint pain and myalgias.  Skin: Negative for itching and rash.  Neurological: Negative for dizziness, tingling and headaches.  Endo/Heme/Allergies: Negative for environmental allergies and polydipsia. Does not bruise/bleed easily.       Negative for hirsutism   Psychiatric/Behavioral: Negative for depression. The patient is not nervous/anxious and does not have insomnia.     Past Medical History:  Past Medical History:  Diagnosis Date  . Crohn's disease ([place])   . External hemorrhoid   . GERD (gastroesophageal reflux disease)    OCC TUMS PRN  . Hypercholesteremia   . Hypertension   . Plantar fasciitis of left foot   . Vasomotor symptoms due to menopause     Past Surgical History:  Past Surgical History:  Procedure Laterality Date  . COLONOSCOPY  2015   normal, was advised to return in 10 years  . COLONOSCOPY WITH PROPOFOL N/A 09/10/2019   Procedure: COLONOSCOPY WITH PROPOFOL;  Surgeon: Jonathon Bellows, MD;  Location: [facility] ENDOSCOPY;  Service: Gastroenterology;  Laterality: N/A;  . COLOSTOMY REVISION  10/08/2019   Procedure: COLON RESECTION RIGHT;  Surgeon: Benjamine Sprague, DO;  Location: [facility] ORS;  Service: General;;  . LAPAROSCOPIC RIGHT COLECTOMY Right 10/08/2019   Procedure: ATTEMPTED LAPAROSCOPIC RIGHT COLECTOMY;  Surgeon: Benjamine Sprague, DO;  Location: [facility] ORS;  Service: General;  Laterality: Right;  . PLANTAR FASCIA RELEASE  Left 04/28/2020   Procedure: ENDOSCOPIC PLANTAR FASCIA RELEASE WITH TOPAZ LEFT;  Surgeon: Albertine Patricia, DPM;  Location: [place];  Service: Podiatry;  Laterality: Left;  LMA WITH POP BLOCK    Gynecologic History:  No history of abnormal Pap smears  Obstetric History: [id number]  Family History:  Family History  Problem Relation Age of Onset  . Diabetes Mother   . Hypertension Mother   . Hyperlipidemia Mother   . Hypertension Father   . Hyperlipidemia Father   . Dementia Father   . Prostate cancer Father   . Heart disease Father   . Breast cancer Neg Hx     Social History:  Social History   Socioeconomic History  . Marital status: Widowed    Spouse name: Not on file  . Number of children: 0  . Years of education: 30  . Highest education level: Not on file  Occupational History  . Not on file  Tobacco Use  . Smoking status: Never Smoker  . Smokeless tobacco: Never Used  Vaping Use  . Vaping Use: Never used  Substance and Sexual Activity  . Alcohol use: Yes    Comment: SOCIAL  . Drug use: No  . Sexual activity: Not Currently    Birth control/protection: None  Other Topics Concern  . Not on file  Social History Narrative  . Not on file   Social Determinants of Health   Financial Resource Strain:   . Difficulty of Paying Living Expenses:   Food Insecurity:   . Worried About Charity fundraiser in the Last Year:   . Arboriculturist in the Last Year:   Transportation Needs:   . Film/video editor (Medical):   Marland Kitchen Lack of Transportation (Non-Medical):   Physical Activity:   . Days of Exercise per Week:   . Minutes of Exercise per Session:   Stress:   . Feeling of Stress :   Social Connections:   . Frequency of Communication with Friends and Family:   . Frequency of Social Gatherings with Friends and Family:   . Attends Religious Services:   . Active Member of Clubs or Organizations:   . Attends Archivist Meetings:   Marland Kitchen Marital  Status:   Intimate Partner Violence:   . Fear of Current or Ex-Partner:   . Emotionally Abused:   Marland Kitchen Physically Abused:   . Sexually Abused:     Allergies:  No Known Allergies  Medications:  Current Outpatient Medications:  .  Ascorbic Acid (VITAMIN C) 1000 MG tablet, Take 1,000 mg by mouth daily., Disp: , Rfl:  .  calcium carbonate (OS-CAL) 1250 (500 Ca) MG chewable tablet, Chew by mouth., Disp: , Rfl:  .  calcium carbonate (TUMS - DOSED IN MG ELEMENTAL CALCIUM) 500 MG chewable tablet, Chew 1 tablet by mouth as needed for indigestion or heartburn., Disp: , Rfl:  .  Cholecalciferol (VITAMIN D3) 50 MCG (2000 UT) TABS, Take 2,000 Units by mouth daily., Disp: , Rfl:  .  estradiol (ESTRACE) 1 MG tablet, Take 1 tablet (1 mg total) by mouth daily., Disp: 90 tablet, Rfl: 3 .  HYDROcodone-acetaminophen (NORCO) 7.5-325 MG tablet, Take  1 tablet by mouth every 6 (six) hours as needed for moderate pain., Disp: 30 tablet, Rfl: 0 .  metoprolol succinate (TOPROL-XL) 25 MG 24 hr tablet, Take by mouth., Disp: , Rfl:  .  Multiple Vitamin (MULTIVITAMIN WITH MINERALS) TABS tablet, Take 1 tablet by mouth daily., Disp: , Rfl:  .  norethindrone (AYGESTIN) 5 MG tablet, Take 0.5 tablets (2.5 mg total) by mouth daily., Disp: 45 tablet, Rfl: 3 .  simvastatin (ZOCOR) 20 MG tablet, Take 20 mg by mouth at bedtime. , Disp: , Rfl:  .  vitamin B-12 (CYANOCOBALAMIN) 1000 MCG tablet, Take 1,000 mcg by mouth daily., Disp: , Rfl:  .  hydrocortisone-pramoxine (ANALPRAM HC) 2.5-1 % rectal cream, Place 1 application rectally 3 (three) times daily., Disp: 30 g, Rfl: 0 Physical Exam Vitals: BP 130/70   Pulse 87   Resp 18   Ht 5\' 4"  (1.626 m)   Wt 171 lb 6.4 oz (77.7 kg)   SpO2 98%   BMI 29.42 kg/m   General: WF in NAD HEENT: normocephalic, anicteric Thyroid: no enlargement, no palpable nodules Pulmonary: No increased work of breathing, CTAB Cardiovascular: RRR without murmur Breast: Breast symmetrical, no tenderness,  no palpable nodules or masses, no skin or nipple retraction present, no nipple discharge.  No axillary or supraclavicular lymphadenopathy. Abdomen soft, non-tender, non-distended.  Umbilicus without lesions. Midline vertical scar below umbilicus healing well.  No hepatomegaly. No masses palpable. No evidence of hernia  Genitourinary:  External: Single scattered inflamed papules on panty line bilaterally. .  Normal urethral meatus, normal Bartholin's and Skene's glands.  Skin scraping KOH was negative for hyphae.  Vagina: Normal vaginal mucosa, no evidence of prolapse. Wet prep negative for hyphae, Trich and clue cells.   Cervix: stenotic, no bleeding  Uterus:AV, NSSC, mobile, NT  Adnexa:  no adnexal masses, NT  Rectal: deferred, cluster of external hemorrhoids noted  Lymphatic: no evidence of inguinal lymphadenopathy Extremities: no edema, erythema, or tenderness Neurologic: Grossly intact Psychiatric: mood appropriate, affect full    Assessment: 59 yo menopausal female with normal well woman exam. Postmenopausal hormone therapy External hemorrhoids Possible fungal infection of panty line.  Plan:1) Mammogram  - recommend yearly screening mammogram - patient has scheduled screening mammogram at [facility] for later this month  2) Pap smear - ASCCP guidelines and rational discussed.   - Pap not done today. Patient opts for every 3 year screening interval. Next due 2022  3) Osteoporosis prevention-HT and adequate calcium and vitamin D intake  4) Menopausal symptoms: Refills of Estrace 1 mgm and Norethindrone 2.5 mgm daily sent to pharmacy  5) Routine healthcare maintenance per her PCP Dr Kary Kos.   5) Colonoscopy -UTD. RX for Analpram [facility] sent to pharmacy. Recommend Lotrimin for rash on panty line  6) Follow up 1 year for routine annual  Dalia Heading, [place]

## 2020-06-02 ENCOUNTER — Other Ambulatory Visit: Payer: Self-pay

## 2020-06-02 ENCOUNTER — Ambulatory Visit (INDEPENDENT_AMBULATORY_CARE_PROVIDER_SITE_OTHER): Payer: BC Managed Care – PPO | Admitting: Certified Nurse Midwife

## 2020-06-02 ENCOUNTER — Encounter: Payer: Self-pay | Admitting: Certified Nurse Midwife

## 2020-06-02 VITALS — BP 130/70 | HR 87 | Resp 18 | Ht 64.0 in | Wt 171.4 lb

## 2020-06-02 DIAGNOSIS — N951 Menopausal and female climacteric states: Secondary | ICD-10-CM | POA: Diagnosis not present

## 2020-06-02 DIAGNOSIS — Z0000 Encounter for general adult medical examination without abnormal findings: Secondary | ICD-10-CM | POA: Diagnosis not present

## 2020-06-02 DIAGNOSIS — Z01419 Encounter for gynecological examination (general) (routine) without abnormal findings: Secondary | ICD-10-CM

## 2020-06-02 MED ORDER — HYDROCORT-PRAMOXINE (PERIANAL) 2.5-1 % EX CREA: 1.0000 "application " | TOPICAL_CREAM | Freq: Three times a day (TID) | CUTANEOUS | 0 refills | Status: DC

## 2020-06-02 MED ORDER — ESTRADIOL 1 MG PO TABS: 1.0000 mg | ORAL_TABLET | Freq: Every day | ORAL | 3 refills | Status: DC

## 2020-06-02 MED ORDER — NORETHINDRONE ACETATE 5 MG PO TABS: 2.5000 mg | ORAL_TABLET | Freq: Every day | ORAL | 3 refills | Status: DC

## 2020-06-03 ENCOUNTER — Encounter: Payer: Self-pay | Admitting: Certified Nurse Midwife

## 2020-06-03 DIAGNOSIS — I10 Essential (primary) hypertension: Secondary | ICD-10-CM | POA: Insufficient documentation

## 2020-06-15 ENCOUNTER — Ambulatory Visit
Admission: RE | Admit: 2020-06-15 | Discharge: 2020-06-15 | Disposition: A | Discharge status: Home / Self Care | Payer: BC Managed Care – PPO | Source: Ambulatory Visit | Attending: Family Medicine | Admitting: Family Medicine

## 2020-06-15 DIAGNOSIS — Z1231 Encounter for screening mammogram for malignant neoplasm of breast: Secondary | ICD-10-CM | POA: Insufficient documentation

## 2020-07-20 ENCOUNTER — Ambulatory Visit: Payer: BC Managed Care – PPO | Admitting: Urology

## 2021-02-16 ENCOUNTER — Ambulatory Visit: Payer: BC Managed Care – PPO | Admitting: Dermatology

## 2021-03-13 ENCOUNTER — Ambulatory Visit
Admission: RE | Admit: 2021-03-13 | Discharge: 2021-03-13 | Disposition: A | Discharge status: Home / Self Care | Payer: BC Managed Care – PPO | Source: Ambulatory Visit | Attending: Family Medicine | Admitting: Family Medicine

## 2021-03-13 ENCOUNTER — Other Ambulatory Visit: Payer: Self-pay

## 2021-03-13 ENCOUNTER — Other Ambulatory Visit: Payer: Self-pay | Admitting: Family Medicine

## 2021-03-13 DIAGNOSIS — R600 Localized edema: Secondary | ICD-10-CM | POA: Diagnosis present

## 2021-03-29 DIAGNOSIS — I890 Lymphedema, not elsewhere classified: Secondary | ICD-10-CM | POA: Insufficient documentation

## 2021-03-29 DIAGNOSIS — I872 Venous insufficiency (chronic) (peripheral): Secondary | ICD-10-CM | POA: Insufficient documentation

## 2021-03-29 NOTE — Progress Notes (Signed)
MRN : [id number]  Patricia Callahan is a 60 y.o. ([birth year]) female who presents with chief complaint of No chief complaint on file. Marland Kitchen  History of Present Illness:   Patient is seen for evaluation of leg pain and leg swelling predominantly of the right foot. The patient first noticed the swelling remotely.  Recently the swelling and the pain increased dramatically particularly in the right ankle.  She was evaluated and the possibility of gout was entertained.  Apparently she was started on a medicine to treat gout and the symptoms resolved quite rapidly.  However, the patient has no history of gout and there is no family history of gout and her uric acid levels were low normal.  This is raise some concern that she actually did have a gouty episode and whether this could just be venous insufficiency.    In general the pain and swelling worsens with prolonged dependency and improve with elevation. The pain is unrelated to activity.  The patient notes that in the morning the legs are significantly improved but they steadily worsened throughout the course of the day. The patient also notes a steady worsening of the discoloration in the ankle and shin area.   The patient denies claudication symptoms.  The patient denies symptoms consistent with rest pain.  The patient denies and extensive history of DJD and LS spine disease.  The patient has no had any past angiography, interventions or vascular surgery.  Elevation makes the leg symptoms better, dependency makes them much worse. There is no history of ulcerations. The patient denies any recent changes in medications.  The patient has not been wearing graduated compression.  The patient denies a history of DVT or PE. There is no prior history of phlebitis. There is no history of primary lymphedema.  No history of malignancies. No history of trauma or groin or pelvic surgery. There is no history of radiation treatment to the groin or pelvis  The  patient denies amaurosis fugax or recent TIA symptoms. There are no recent neurological changes noted. The patient denies recent episodes of angina or shortness of breath  No outpatient medications have been marked as taking for the 03/30/21 encounter (Appointment) with Delana Meyer, Dolores Lory, MD.    Past Medical History:  Diagnosis Date  . Crohn's disease ([place])   . External hemorrhoid   . GERD (gastroesophageal reflux disease)    OCC TUMS PRN  . Hypercholesteremia   . Hypertension   . Plantar fasciitis of left foot   . Vasomotor symptoms due to menopause     Past Surgical History:  Procedure Laterality Date  . COLONOSCOPY  2015   normal, was advised to return in 10 years  . COLONOSCOPY WITH PROPOFOL N/A 09/10/2019   Procedure: COLONOSCOPY WITH PROPOFOL;  Surgeon: Jonathon Bellows, MD;  Location: [facility] ENDOSCOPY;  Service: Gastroenterology;  Laterality: N/A;  . COLOSTOMY REVISION  10/08/2019   Procedure: COLON RESECTION RIGHT;  Surgeon: Benjamine Sprague, DO;  Location: [facility] ORS;  Service: General;;  . LAPAROSCOPIC RIGHT COLECTOMY Right 10/08/2019   Procedure: ATTEMPTED LAPAROSCOPIC RIGHT COLECTOMY;  Surgeon: Benjamine Sprague, DO;  Location: [facility] ORS;  Service: General;  Laterality: Right;  . PLANTAR FASCIA RELEASE Left 04/28/2020   Procedure: ENDOSCOPIC PLANTAR FASCIA RELEASE WITH TOPAZ LEFT;  Surgeon: Albertine Patricia, DPM;  Location: [place];  Service: Podiatry;  Laterality: Left;  LMA WITH POP BLOCK    Social History Social History   Tobacco Use  . Smoking status: Never Smoker  .  Smokeless tobacco: Never Used  Vaping Use  . Vaping Use: Never used  Substance Use Topics  . Alcohol use: Yes    Comment: SOCIAL  . Drug use: No    Family History Family History  Problem Relation Age of Onset  . Diabetes Mother   . Hypertension Mother   . Hyperlipidemia Mother   . Hypertension Father   . Hyperlipidemia Father   . Dementia Father   . Prostate cancer Father   . Heart disease  Father   . Breast cancer Neg Hx   No family history of bleeding/clotting disorders, porphyria or autoimmune disease   No Known Allergies   REVIEW OF SYSTEMS (Negative unless checked)  Constitutional: [] Weight loss  [] Fever  [] Chills Cardiac: [] Chest pain   [] Chest pressure   [] Palpitations   [] Shortness of breath when laying flat   [] Shortness of breath with exertion. Vascular:  [] Pain in legs with walking   [x] Pain in legs at rest  [] History of DVT   [] Phlebitis   [x] Swelling in legs   [] Varicose veins   [] Non-healing ulcers Pulmonary:   [] Uses home oxygen   [] Productive cough   [] Hemoptysis   [] Wheeze  [] COPD   [] Asthma Neurologic:  [] Dizziness   [] Seizures   [] History of stroke   [] History of TIA  [] Aphasia   [] Vissual changes   [] Weakness or numbness in arm   [] Weakness or numbness in leg Musculoskeletal:   [] Joint swelling   [x] Joint pain   [] Low back pain Hematologic:  [] Easy bruising  [] Easy bleeding   [] Hypercoagulable state   [] Anemic Gastrointestinal:  [] Diarrhea   [] Vomiting  [] Gastroesophageal reflux/heartburn   [] Difficulty swallowing. Genitourinary:  [] Chronic kidney disease   [] Difficult urination  [] Frequent urination   [] Blood in urine Skin:  [] Rashes   [] Ulcers  Psychological:  [] History of anxiety   []  History of major depression.  Physical Examination  There were no vitals filed for this visit. There is no height or weight on file to calculate BMI. Gen: WD/WN, NAD Head: [place]/AT, No temporalis wasting.  Ear/Nose/Throat: Hearing grossly intact, nares w/o erythema or drainage, poor dentition Eyes: PER, EOMI, sclera nonicteric.  Neck: Supple, no masses.  No bruit or JVD.  Pulmonary:  Good air movement, clear to auscultation bilaterally, no use of accessory muscles.  Cardiac: RRR, normal S1, S2, no Murmurs. Vascular: scattered varicosities present bilaterally.  Mild venous stasis changes to the legs bilaterally.  2-3+ soft pitting edema Right > Left Vessel Right Left   Radial Palpable Palpable  PT Palpable Palpable  DP Palpable Palpable  Gastrointestinal: soft, non-distended. No guarding/no peritoneal signs.  Musculoskeletal: M/S 5/5 throughout.  No deformity or atrophy.  Neurologic: CN 2-12 intact. Pain and light touch intact in extremities.  Symmetrical.  Speech is fluent. Motor exam as listed above. Psychiatric: Judgment intact, Mood & affect appropriate for pt's clinical situation. Dermatologic: Venous rashes no ulcers noted.  No changes consistent with cellulitis. Lymph : No lichenification or skin changes of chronic lymphedema.  CBC Lab Results  Component Value Date   WBC 13.5 (H) 11/30/2019   HGB 14.1 11/30/2019   HCT 41.8 11/30/2019   MCV 92 11/30/2019   PLT 506 (H) 11/30/2019    BMET    Component Value Date/Time   NA 142 10/22/2019 1610   K 4.2 10/22/2019 1610   CL 102 10/22/2019 1610   CO2 22 10/22/2019 1610   GLUCOSE 78 10/22/2019 1610   GLUCOSE 84 10/10/2019 0548   BUN 15 10/22/2019 1610  CREATININE 0.88 10/22/2019 1610   CALCIUM 10.7 (H) 10/22/2019 1610   GFRNONAA 73 10/22/2019 1610   GFRAA 84 10/22/2019 1610   CrCl cannot be calculated (Patient's most recent lab result is older than the maximum 21 days allowed.).  COAG No results found for: INR, PROTIME  Radiology US Venous Img Lower Unilateral Right (DVT)  Result Date: 03/13/2021 CLINICAL DATA:  Right lower extremity edema EXAM: RIGHT LOWER EXTREMITY VENOUS DOPPLER ULTRASOUND TECHNIQUE: Gray-scale sonography with compression, as well as color and duplex ultrasound, were performed to evaluate the deep venous system(s) from the level of the common femoral vein through the popliteal and proximal calf veins. COMPARISON:  None. FINDINGS: VENOUS Normal compressibility of the common femoral, superficial femoral, and popliteal veins, as well as the visualized calf veins. Visualized portions of profunda femoral vein and great saphenous vein unremarkable. No filling defects to  suggest DVT on grayscale or color Doppler imaging. Doppler waveforms show normal direction of venous flow, normal respiratory plasticity and response to augmentation. Limited views of the contralateral common femoral vein are unremarkable. OTHER None. Limitations: none IMPRESSION: Negative. Electronically Signed   By: Miachel Roux M.D.   On: 03/13/2021 15:20      Assessment/Plan 1. Pain and swelling of lower leg, unspecified laterality I have had a long discussion with the patient regarding swelling and why it  causes symptoms.  Patient will begin wearing graduated compression stockings class 1 (20-30 mmHg) on a daily basis a prescription was given. The patient will  beginning wearing the stockings first thing in the morning and removing them in the evening. The patient is instructed specifically not to sleep in the stockings.   In addition, behavioral modification will be initiated.  This will include frequent elevation, use of over the counter pain medications and exercise such as walking.  I have reviewed systemic causes for chronic edema such as liver, kidney and cardiac etiologies.  The patient denies problems with these organ systems.    Consideration for a lymph pump will also be made based upon the effectiveness of conservative therapy.  This would help to improve the edema control and prevent sequela such as ulcers and infections   Patient should undergo duplex ultrasound of the venous system to ensure that DVT or reflux is not present.  The patient will follow-up with me after the ultrasound.   - VAS [place] LOWER EXTREMITY VENOUS REFLUX; Future  2. Chronic venous insufficiency I have had a long discussion with the patient regarding swelling and why it  causes symptoms.  Patient will begin wearing graduated compression stockings class 1 (20-30 mmHg) on a daily basis a prescription was given. The patient will  beginning wearing the stockings first thing in the morning and removing them in  the evening. The patient is instructed specifically not to sleep in the stockings.   In addition, behavioral modification will be initiated.  This will include frequent elevation, use of over the counter pain medications and exercise such as walking.  I have reviewed systemic causes for chronic edema such as liver, kidney and cardiac etiologies.  The patient denies problems with these organ systems.    Consideration for a lymph pump will also be made based upon the effectiveness of conservative therapy.  This would help to improve the edema control and prevent sequela such as ulcers and infections   Patient should undergo duplex ultrasound of the venous system to ensure that DVT or reflux is not present.  The patient will follow-up  with me after the ultrasound.   - VAS [place] LOWER EXTREMITY VENOUS REFLUX; Future  3. Lymphedema I have had a long discussion with the patient regarding swelling and why it  causes symptoms.  Patient will begin wearing graduated compression stockings class 1 (20-30 mmHg) on a daily basis a prescription was given. The patient will  beginning wearing the stockings first thing in the morning and removing them in the evening. The patient is instructed specifically not to sleep in the stockings.   In addition, behavioral modification will be initiated.  This will include frequent elevation, use of over the counter pain medications and exercise such as walking.  I have reviewed systemic causes for chronic edema such as liver, kidney and cardiac etiologies.  The patient denies problems with these organ systems.    Consideration for a lymph pump will also be made based upon the effectiveness of conservative therapy.  This would help to improve the edema control and prevent sequela such as ulcers and infections   Patient should undergo duplex ultrasound of the venous system to ensure that DVT or reflux is not present.  The patient will follow-up with me after the ultrasound.     4. Hypercholesterolemia Continue statin as ordered and reviewed, no changes at this time   5. Essential hypertension Continue antihypertensive medications as already ordered, these medications have been reviewed and there are no changes at this time.     Hortencia Pilar, MD  03/29/2021 4:52 PM

## 2021-03-30 ENCOUNTER — Ambulatory Visit (INDEPENDENT_AMBULATORY_CARE_PROVIDER_SITE_OTHER): Payer: BC Managed Care – PPO | Admitting: Vascular Surgery

## 2021-03-30 ENCOUNTER — Other Ambulatory Visit: Payer: Self-pay

## 2021-03-30 ENCOUNTER — Encounter (INDEPENDENT_AMBULATORY_CARE_PROVIDER_SITE_OTHER): Payer: Self-pay | Admitting: Vascular Surgery

## 2021-03-30 VITALS — BP 162/87 | HR 72 | Resp 17 | Ht 64.0 in | Wt 181.0 lb

## 2021-03-30 DIAGNOSIS — I10 Essential (primary) hypertension: Secondary | ICD-10-CM

## 2021-03-30 DIAGNOSIS — I890 Lymphedema, not elsewhere classified: Secondary | ICD-10-CM | POA: Diagnosis not present

## 2021-03-30 DIAGNOSIS — I872 Venous insufficiency (chronic) (peripheral): Secondary | ICD-10-CM | POA: Diagnosis not present

## 2021-03-30 DIAGNOSIS — E7800 Pure hypercholesterolemia, unspecified: Secondary | ICD-10-CM | POA: Diagnosis not present

## 2021-03-30 DIAGNOSIS — M7989 Other specified soft tissue disorders: Secondary | ICD-10-CM

## 2021-03-30 DIAGNOSIS — M79669 Pain in unspecified lower leg: Secondary | ICD-10-CM | POA: Diagnosis not present

## 2021-03-31 ENCOUNTER — Encounter (INDEPENDENT_AMBULATORY_CARE_PROVIDER_SITE_OTHER): Payer: Self-pay | Admitting: Vascular Surgery

## 2021-03-31 DIAGNOSIS — M79669 Pain in unspecified lower leg: Secondary | ICD-10-CM | POA: Insufficient documentation

## 2021-04-18 NOTE — Progress Notes (Signed)
MRN : [id number]  Patricia Callahan is a 60 y.o. (07-Aug-1961) female who presents with chief complaint of No chief complaint on file. Marland Kitchen  History of Present Illness:   The patient returns to the office for followup evaluation regarding leg swelling.  The swelling has persisted and the pain associated with swelling continues. There have not been any interval development of a ulcerations or wounds.  Since the previous visit the patient has been wearing graduated compression stockings and has noted little if any improvement in the lymphedema. The patient has been using compression routinely morning until night.  The patient also states elevation during the day and exercise is being done too.  Venous duplex is negative for deep venous abnormality, no superficial reflux identified  No outpatient medications have been marked as taking for the 04/20/21 encounter (Appointment) with Delana Meyer, Dolores Lory, MD.    Past Medical History:  Diagnosis Date  . Crohn's disease ([place])   . External hemorrhoid   . GERD (gastroesophageal reflux disease)    OCC TUMS PRN  . Hypercholesteremia   . Hypertension   . Plantar fasciitis of left foot   . Vasomotor symptoms due to menopause     Past Surgical History:  Procedure Laterality Date  . COLONOSCOPY  2015   normal, was advised to return in 10 years  . COLONOSCOPY WITH PROPOFOL N/A 09/10/2019   Procedure: COLONOSCOPY WITH PROPOFOL;  Surgeon: Jonathon Bellows, MD;  Location: [facility] ENDOSCOPY;  Service: Gastroenterology;  Laterality: N/A;  . COLOSTOMY REVISION  10/08/2019   Procedure: COLON RESECTION RIGHT;  Surgeon: Benjamine Sprague, DO;  Location: [facility] ORS;  Service: General;;  . LAPAROSCOPIC RIGHT COLECTOMY Right 10/08/2019   Procedure: ATTEMPTED LAPAROSCOPIC RIGHT COLECTOMY;  Surgeon: Benjamine Sprague, DO;  Location: [facility] ORS;  Service: General;  Laterality: Right;  . PLANTAR FASCIA RELEASE Left 04/28/2020   Procedure: ENDOSCOPIC PLANTAR FASCIA RELEASE WITH TOPAZ LEFT;   Surgeon: Albertine Patricia, DPM;  Location: [place];  Service: Podiatry;  Laterality: Left;  LMA WITH POP BLOCK    Social History Social History   Tobacco Use  . Smoking status: Never Smoker  . Smokeless tobacco: Never Used  Vaping Use  . Vaping Use: Never used  Substance Use Topics  . Alcohol use: Yes    Comment: SOCIAL  . Drug use: No    Family History Family History  Problem Relation Age of Onset  . Diabetes Mother   . Hypertension Mother   . Hyperlipidemia Mother   . Hypertension Father   . Hyperlipidemia Father   . Dementia Father   . Prostate cancer Father   . Heart disease Father   . Breast cancer Neg Hx     No Known Allergies   REVIEW OF SYSTEMS (Negative unless checked)  Constitutional: [] Weight loss  [] Fever  [] Chills Cardiac: [] Chest pain   [] Chest pressure   [] Palpitations   [] Shortness of breath when laying flat   [] Shortness of breath with exertion. Vascular:  [] Pain in legs with walking   [] Pain in legs at rest  [] History of DVT   [] Phlebitis   [x] Swelling in legs   [x] Varicose veins   [] Non-healing ulcers Pulmonary:   [] Uses home oxygen   [] Productive cough   [] Hemoptysis   [] Wheeze  [] COPD   [] Asthma Neurologic:  [] Dizziness   [] Seizures   [] History of stroke   [] History of TIA  [] Aphasia   [] Vissual changes   [] Weakness or numbness in arm   [] Weakness or numbness  in leg Musculoskeletal:   [] Joint swelling   [x] Joint pain   [] Low back pain Hematologic:  [] Easy bruising  [] Easy bleeding   [] Hypercoagulable state   [] Anemic Gastrointestinal:  [] Diarrhea   [] Vomiting  [] Gastroesophageal reflux/heartburn   [] Difficulty swallowing. Genitourinary:  [] Chronic kidney disease   [] Difficult urination  [] Frequent urination   [] Blood in urine Skin:  [] Rashes   [] Ulcers  Psychological:  [] History of anxiety   []  History of major depression.  Physical Examination  There were no vitals filed for this visit. There is no height or weight on file to  calculate BMI. Gen: WD/WN, NAD Head: [place]/AT, No temporalis wasting.  Ear/Nose/Throat: Hearing grossly intact, nares w/o erythema or drainage Eyes: PER, EOMI, sclera nonicteric.  Neck: Supple, no large masses.   Pulmonary:  Good air movement, no audible wheezing bilaterally, no use of accessory muscles.  Cardiac: RRR, no JVD Vascular: scattered varicosities present bilaterally.  Mild venous stasis changes to the legs bilaterally.  trace soft pitting edema Vessel Right Left  Radial Palpable Palpable  PT Palpable Palpable  DP Palpable Palpable  Gastrointestinal: Non-distended. No guarding/no peritoneal signs.  Musculoskeletal: M/S 5/5 throughout.  No deformity or atrophy.  Neurologic: CN 2-12 intact. Symmetrical.  Speech is fluent. Motor exam as listed above. Psychiatric: Judgment intact, Mood & affect appropriate for pt's clinical situation. Dermatologic: Mild rashes no ulcers noted.  No changes consistent with cellulitis. Lymph : No lichenification or skin changes of chronic lymphedema.  CBC Lab Results  Component Value Date   WBC 13.5 (H) 11/30/2019   HGB 14.1 11/30/2019   HCT 41.8 11/30/2019   MCV 92 11/30/2019   PLT 506 (H) 11/30/2019    BMET    Component Value Date/Time   NA 142 10/22/2019 1610   K 4.2 10/22/2019 1610   CL 102 10/22/2019 1610   CO2 22 10/22/2019 1610   GLUCOSE 78 10/22/2019 1610   GLUCOSE 84 10/10/2019 0548   BUN 15 10/22/2019 1610   CREATININE 0.88 10/22/2019 1610   CALCIUM 10.7 (H) 10/22/2019 1610   GFRNONAA 73 10/22/2019 1610   GFRAA 84 10/22/2019 1610   CrCl cannot be calculated (Patient's most recent lab result is older than the maximum 21 days allowed.).  COAG No results found for: INR, PROTIME  Radiology No results found.   Assessment/Plan 1. Lymphedema No surgery or intervention at this point in time.  I have reviewed my discussion with the patient regarding venous insufficiency and why it causes symptoms. I have discussed with the  patient the chronic skin changes that accompany venous insufficiency and the long term sequela such as ulceration. Patient will contnue wearing graduated compression stockings on a daily basis, as this has provided excellent control of his edema. The patient will put the stockings on first thing in the morning and removing them in the evening. The patient is reminded not to sleep in the stockings.  In addition, behavioral modification including elevation during the day will be initiated. Exercise is strongly encouraged.  Duplex ultrasound of the bilateral lower extremity is negative for reflux or deep venous abnormalities  Given the patient's good control and lack of any problems regarding the venous insufficiency and lymphedema a lymph pump in not need at this time.  The patient will follow up with me PRN should anything change.  The patient voices agreement with this plan.   2. Chronic venous insufficiency No surgery or intervention at this point in time.  I have reviewed my discussion with the  patient regarding venous insufficiency and why it causes symptoms. I have discussed with the patient the chronic skin changes that accompany venous insufficiency and the long term sequela such as ulceration. Patient will contnue wearing graduated compression stockings on a daily basis, as this has provided excellent control of his edema. The patient will put the stockings on first thing in the morning and removing them in the evening. The patient is reminded not to sleep in the stockings.  In addition, behavioral modification including elevation during the day will be initiated. Exercise is strongly encouraged.  Duplex ultrasound of the bilateral lower extremity is negative for reflux or deep venous abnormalities Given the patient's good control and lack of any problems regarding the venous insufficiency and lymphedema a lymph pump in not need at this time.  The patient will follow up with me PRN should  anything change.  The patient voices agreement with this plan.   3. Essential hypertension Continue antihypertensive medications as already ordered, these medications have been reviewed and there are no changes at this time.   4. Hypercholesterolemia Continue statin as ordered and reviewed, no changes at this time    Hortencia Pilar, MD  04/18/2021 3:45 PM

## 2021-04-20 ENCOUNTER — Encounter (INDEPENDENT_AMBULATORY_CARE_PROVIDER_SITE_OTHER): Payer: Self-pay | Admitting: Vascular Surgery

## 2021-04-20 ENCOUNTER — Other Ambulatory Visit: Payer: Self-pay

## 2021-04-20 ENCOUNTER — Ambulatory Visit (INDEPENDENT_AMBULATORY_CARE_PROVIDER_SITE_OTHER): Payer: BC Managed Care – PPO

## 2021-04-20 ENCOUNTER — Ambulatory Visit (INDEPENDENT_AMBULATORY_CARE_PROVIDER_SITE_OTHER): Payer: BC Managed Care – PPO | Admitting: Vascular Surgery

## 2021-04-20 ENCOUNTER — Encounter (INDEPENDENT_AMBULATORY_CARE_PROVIDER_SITE_OTHER): Payer: BC Managed Care – PPO

## 2021-04-20 VITALS — BP 165/98 | HR 81 | Ht 64.0 in | Wt 175.0 lb

## 2021-04-20 DIAGNOSIS — M7989 Other specified soft tissue disorders: Secondary | ICD-10-CM

## 2021-04-20 DIAGNOSIS — I872 Venous insufficiency (chronic) (peripheral): Secondary | ICD-10-CM | POA: Diagnosis not present

## 2021-04-20 DIAGNOSIS — I10 Essential (primary) hypertension: Secondary | ICD-10-CM

## 2021-04-20 DIAGNOSIS — M79669 Pain in unspecified lower leg: Secondary | ICD-10-CM | POA: Diagnosis not present

## 2021-04-20 DIAGNOSIS — I890 Lymphedema, not elsewhere classified: Secondary | ICD-10-CM

## 2021-04-20 DIAGNOSIS — E7800 Pure hypercholesterolemia, unspecified: Secondary | ICD-10-CM

## 2021-04-25 ENCOUNTER — Ambulatory Visit: Payer: BC Managed Care – PPO | Admitting: Urology

## 2021-05-15 ENCOUNTER — Other Ambulatory Visit: Payer: Self-pay | Admitting: Family Medicine

## 2021-05-15 DIAGNOSIS — Z1231 Encounter for screening mammogram for malignant neoplasm of breast: Secondary | ICD-10-CM

## 2021-05-23 ENCOUNTER — Ambulatory Visit: Payer: BC Managed Care – PPO | Admitting: Dermatology

## 2021-06-02 ENCOUNTER — Other Ambulatory Visit: Payer: Self-pay

## 2021-06-02 DIAGNOSIS — N951 Menopausal and female climacteric states: Secondary | ICD-10-CM

## 2021-06-02 MED ORDER — NORETHINDRONE ACETATE 5 MG PO TABS: 2.5000 mg | ORAL_TABLET | Freq: Every day | ORAL | 0 refills | Status: DC

## 2021-06-02 NOTE — Telephone Encounter (Signed)
Refill request from [facility] [address] [address] recv'd for norethindrone 5mg ; refill eRx'd as pt has appt c [facility] 06/21/21

## 2021-06-21 ENCOUNTER — Ambulatory Visit: Payer: BC Managed Care – PPO | Admitting: Urology

## 2021-06-21 ENCOUNTER — Ambulatory Visit: Payer: BC Managed Care – PPO | Admitting: Obstetrics and Gynecology

## 2021-06-26 IMAGING — MG DIGITAL SCREENING BILATERAL MAMMOGRAM WITH TOMO AND CAD
6 of 10 series · 6 of 30 positions shown · non-contrast
Comparison: Previous exam(s).

CLINICAL DATA: Screening.

EXAM:
DIGITAL SCREENING BILATERAL MAMMOGRAM WITH TOMO AND CAD

[R MLO synth-2D]
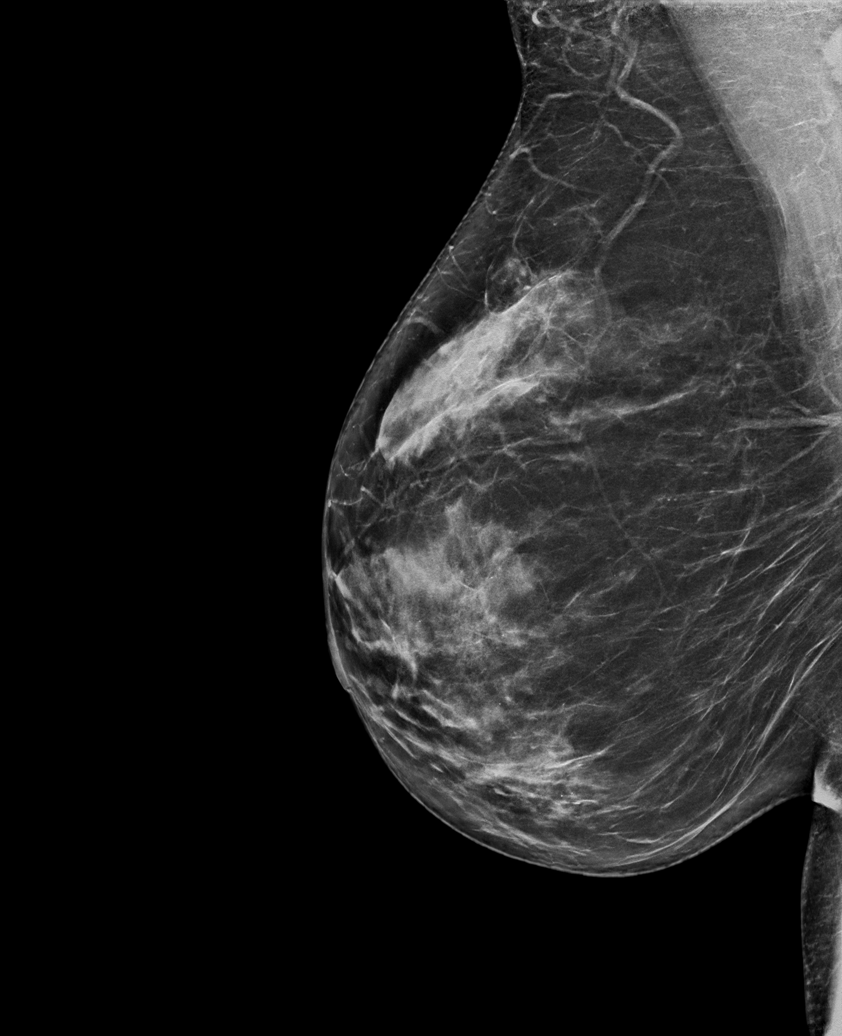

[L CC synth-2D]
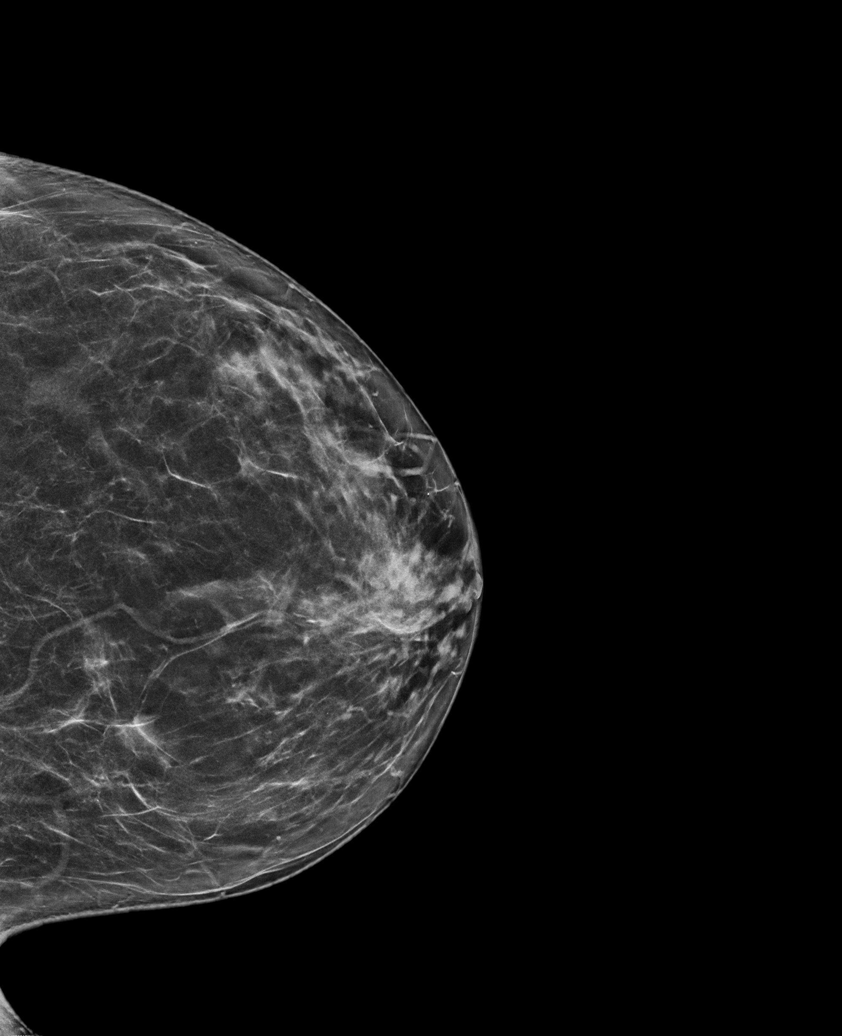

[R XCCL synth-2D]
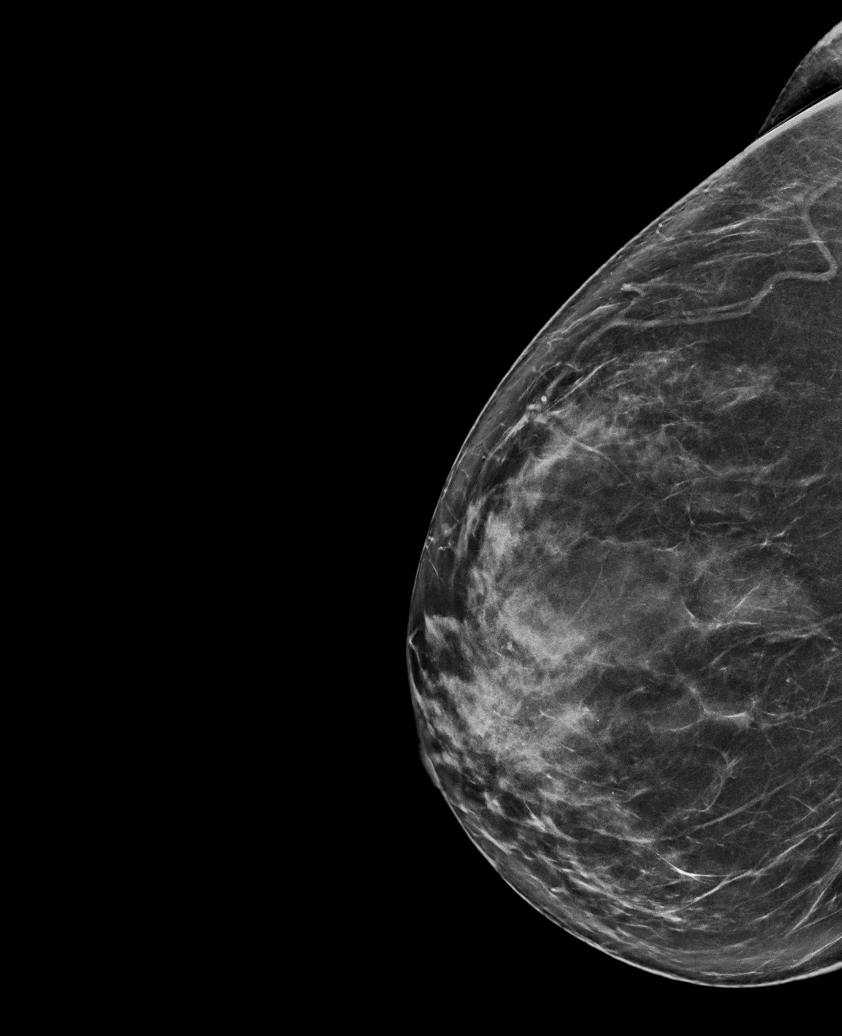

[R CC synth-2D]
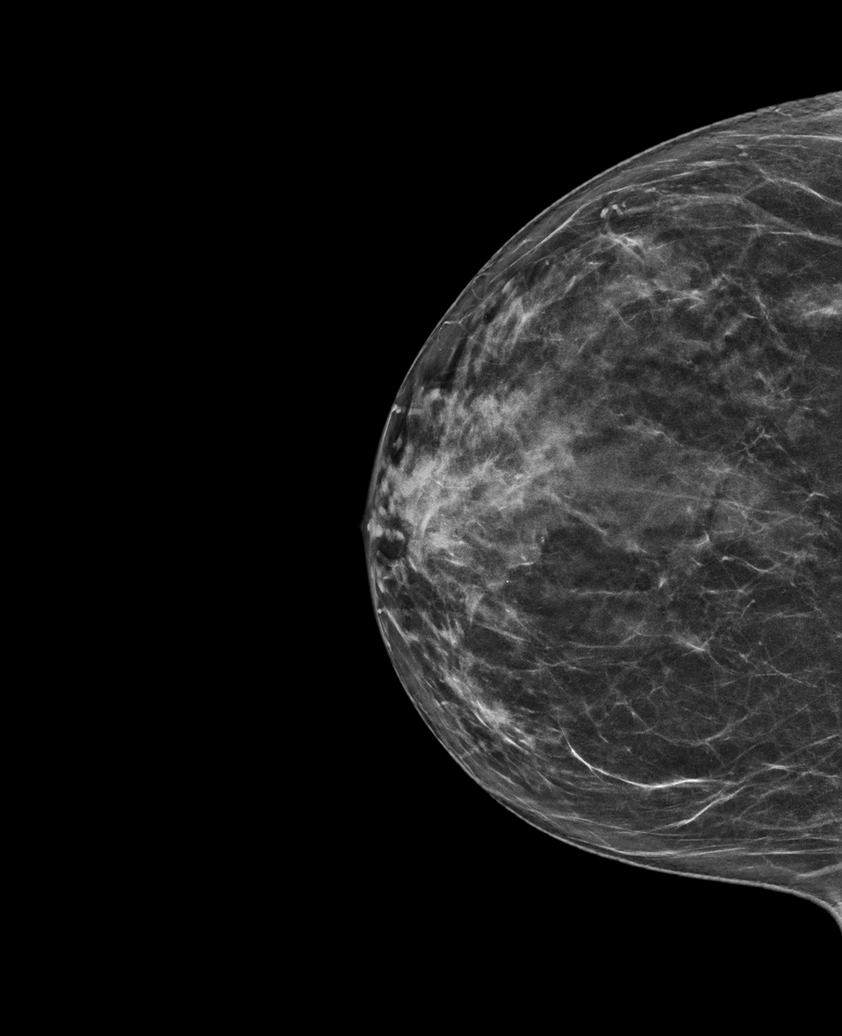

[L MLO synth-2D]
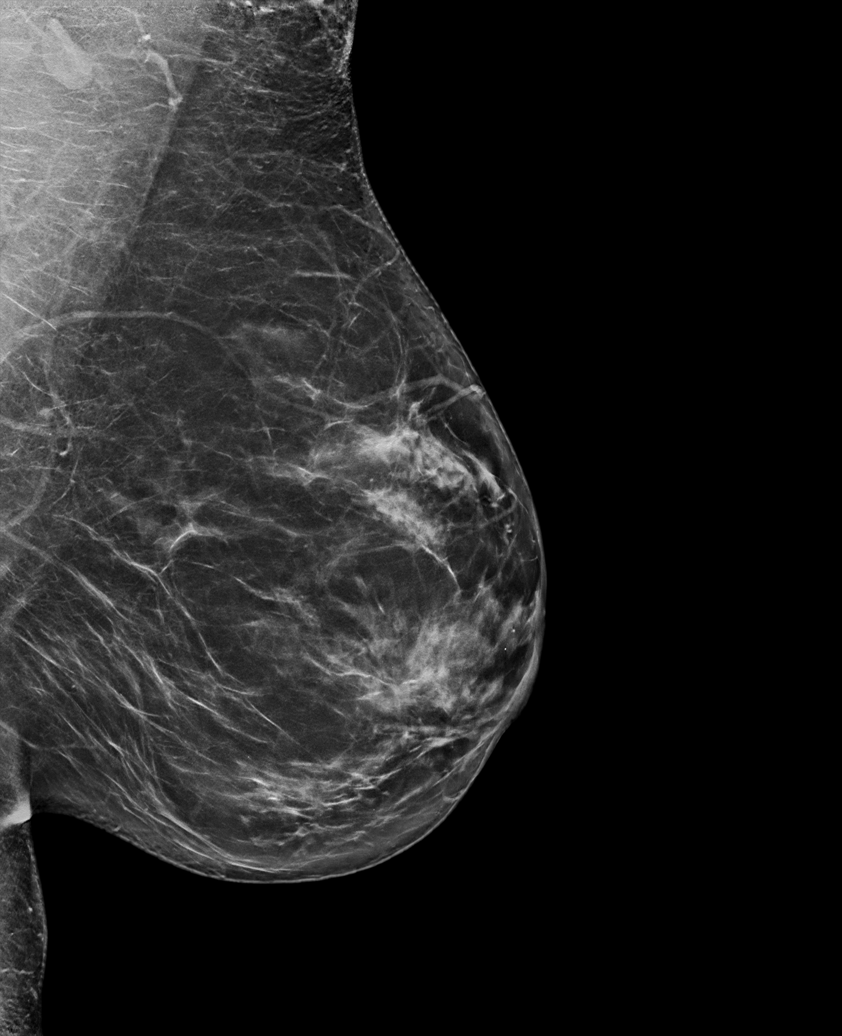

[R MLO tomo · tomo slice 40/79.0]
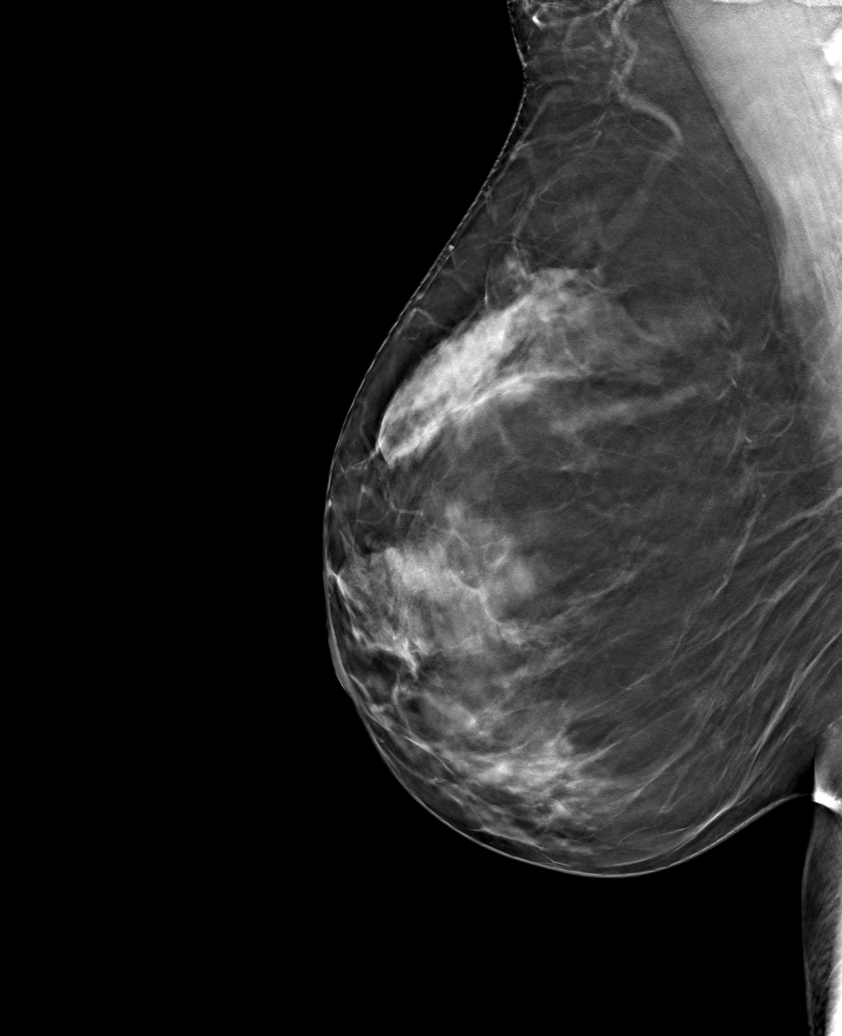

[6 of 30 positions shown; findings below may reference images not displayed]

ACR Breast Density Category c: The breast tissue is heterogeneously
dense, which may obscure small masses.
FINDINGS: There are no findings suspicious for malignancy. Images were
processed with CAD.
IMPRESSION: No mammographic evidence of malignancy. A result letter of this
screening mammogram will be mailed directly to the patient.

RECOMMENDATION:
Screening mammogram in one year. (Code:[id number]-U-LHB)

BI-RADS CATEGORY  1: Negative.

## 2021-07-11 NOTE — Progress Notes (Signed)
07/12/21 3:32 PM   Patricia Callahan 08-07-1961 [id number]  Referring provider:  Maryland Pink, MD [address] [facility] [place],  [place] [address] Chief Complaint  Patient presents with   Hematuria     HPI: Patricia Callahan is a 60 y.o.female who presents today for annual follow-up with UA.  She has a personal history of microscopic hematuria. Last cystoscopy was in Aug 2020 which was unremarkable.  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  She has been doing well from that standpoint.  She had foot surgery last year. She states her body produces inflammation. She had a colonoscopy last year she had no cancer just inflammation.   In terms of urinary symptoms, she is doing well.  She did have a history of recurrent infections has not had any over the past year.  She denies any dysuria or gross hematuria.   PMH: Past Medical History:  Diagnosis Date   Crohn's disease ([name])    External hemorrhoid    GERD (gastroesophageal reflux disease)    OCC TUMS PRN   Hypercholesteremia    Hypertension    Plantar fasciitis of left foot    Vasomotor symptoms due to menopause     Surgical History: Past Surgical History:  Procedure Laterality Date   COLONOSCOPY  2015   normal, was advised to return in 10 years   COLONOSCOPY WITH PROPOFOL N/A 09/10/2019   Procedure: COLONOSCOPY WITH PROPOFOL;  Surgeon: Jonathon Bellows, MD;  Location: [facility] ENDOSCOPY;  Service: Gastroenterology;  Laterality: N/A;   COLOSTOMY REVISION  10/08/2019   Procedure: COLON RESECTION RIGHT;  Surgeon: Benjamine Sprague, DO;  Location: [facility] ORS;  Service: General;;   LAPAROSCOPIC RIGHT COLECTOMY Right 10/08/2019   Procedure: ATTEMPTED LAPAROSCOPIC RIGHT COLECTOMY;  Surgeon: Benjamine Sprague, DO;  Location: [facility] ORS;  Service: General;  Laterality: Right;    PLANTAR FASCIA RELEASE Left 04/28/2020   Procedure: ENDOSCOPIC PLANTAR FASCIA RELEASE WITH TOPAZ LEFT;  Surgeon: Albertine Patricia, DPM;  Location: [name];  Service: Podiatry;  Laterality: Left;  LMA WITH POP BLOCK    Home Medications:  Allergies as of 07/12/2021   No Known Allergies      Medication List        Accurate as of July 12, 2021  3:32 PM. If you have any questions, ask your nurse or doctor.          STOP taking these medications    calcium carbonate 1250 (500 Ca) MG chewable tablet Commonly known as: OS-CAL Stopped by: Hollice Espy, MD   diclofenac 75 MG EC tablet Commonly known as: VOLTAREN Stopped by: Hollice Espy, MD   HYDROcodone-acetaminophen 7.5-325 MG tablet Commonly known as: Norco Stopped by: Hollice Espy, MD   hydrocortisone-pramoxine 2.5-1 % rectal cream Commonly known as: Analpram HC Stopped by: Hollice Espy, MD   triamcinolone ointment 0.1 % Commonly known as: KENALOG Stopped by: Hollice Espy, MD       TAKE these medications    calcium carbonate 500 MG chewable tablet Commonly known as: TUMS - dosed in mg elemental calcium Chew 1 tablet by mouth as needed for indigestion or heartburn.   Digestive Advantage Gummies Chew Chew by mouth.   estradiol 1 MG tablet Commonly known as: ESTRACE Take 1 tablet (1 mg total) by mouth daily.   fexofenadine 180  MG tablet Commonly known as: ALLEGRA Take 180 mg by mouth daily.   meloxicam 15 MG tablet Commonly known as: MOBIC Take 15 mg by mouth daily.   metoprolol succinate 25 MG 24 hr tablet Commonly known as: TOPROL-XL Take by mouth.   multivitamin with minerals Tabs tablet Take 1 tablet by mouth daily.   norethindrone 5 MG tablet Commonly known as: AYGESTIN Take 0.5 tablets (2.5 mg total) by mouth daily.   nystatin powder Commonly known as: MYCOSTATIN/NYSTOP Apply topically daily.   simvastatin 20 MG tablet Commonly known as: ZOCOR Take 20 mg by mouth at  bedtime.   vitamin B-12 1000 MCG tablet Commonly known as: CYANOCOBALAMIN Take 1,000 mcg by mouth daily. What changed: Another medication with the same name was removed. Continue taking this medication, and follow the directions you see here. Changed by: Hollice Espy, MD   vitamin C 1000 MG tablet Take 1,000 mg by mouth daily.   Vitamin D3 50 MCG (2000 UT) Tabs Take 2,000 Units by mouth daily.        Allergies: No Known Allergies  Family History: Family History  Problem Relation Age of Onset   Diabetes Mother    Hypertension Mother    Hyperlipidemia Mother    Hypertension Father    Hyperlipidemia Father    Dementia Father    Prostate cancer Father    Heart disease Father    Breast cancer Neg Hx     Social History:  reports that she has never smoked. She has never used smokeless tobacco. She reports current alcohol use. She reports that she does not use drugs.   Physical Exam: BP (!) 170/97   Pulse 80   Ht '5\' 4"'$  (1.626 m)   Wt 175 lb (79.4 kg)   BMI 30.04 kg/m   Constitutional:  Alert and oriented, No acute distress. HEENT: [place] AT, moist mucus membranes.  Trachea midline, no masses. Cardiovascular: No clubbing, cyanosis, or edema. Respiratory: Normal respiratory effort, no increased work of breathing. Skin: No rashes, bruises or suspicious lesions. Neurologic: Grossly intact, no focal deficits, moving all 4 extremities. Psychiatric: Normal mood and affect.  Laboratory Data:  Lab Results  Component Value Date   CREATININE 0.88 10/22/2019    Urinalysis Results for orders placed or performed in visit on 07/12/21  Microscopic Examination   Urine  Result Value Ref Range   WBC, UA 0-5 0 - 5 /hpf   RBC 11-30 (A) 0 - 2 /hpf   Epithelial Cells (non renal) >10 (A) 0 - 10 /hpf   Bacteria, UA Moderate (A) None seen/Few  Urinalysis, Complete  Result Value Ref Range   Specific Gravity, UA 1.030 1.005 - 1.030   pH, UA 5.0 5.0 - 7.5   Color, UA Yellow Yellow    Appearance Ur Hazy (A) Clear   Leukocytes,UA Negative Negative   Protein,UA Negative Negative/Trace   Glucose, UA Negative Negative   Ketones, UA Negative Negative   RBC, UA 3+ (A) Negative   Bilirubin, UA Negative Negative   Urobilinogen, Ur 0.2 0.2 - 1.0 mg/dL   Nitrite, UA Negative Negative   Microscopic Examination See below:       Assessment & Plan:    History of recurrent UTI - asymptomatic today  - Adequate emptying of bladder - UA revealed microscopic hematuria   Microscopic hematuria  - Persistent   -Plan to repeat cystoscopy given that 2 years since her last scope to reevaluate her bladder -Hold off on further upper tract  imaging given that she had multiple CTs including a negative CT urogram 2 years ago, may consider alternative imaging down the road/renal ultrasound if she continues to have persistent blood or worsening hematuria  Follow-up for cystoscopy and UA  I,Kailey Littlejohn,acting as a scribe for Hollice Espy, MD.,have documented all relevant documentation on the behalf of Hollice Espy, MD,as directed by  Hollice Espy, MD while in the presence of Hollice Espy, MD.  I have reviewed the above documentation for accuracy and completeness, and I agree with the above.   Hollice Espy, MD   [facility] [address], [place], [place] [address] [phone]

## 2021-07-12 ENCOUNTER — Other Ambulatory Visit: Payer: Self-pay

## 2021-07-12 ENCOUNTER — Ambulatory Visit (INDEPENDENT_AMBULATORY_CARE_PROVIDER_SITE_OTHER): Payer: BC Managed Care – PPO | Admitting: Urology

## 2021-07-12 VITALS — BP 170/97 | HR 80 | Ht 64.0 in | Wt 175.0 lb

## 2021-07-12 DIAGNOSIS — R3129 Other microscopic hematuria: Secondary | ICD-10-CM | POA: Diagnosis not present

## 2021-07-12 NOTE — Patient Instructions (Signed)
Cystoscopy Cystoscopy is a procedure that is used to help diagnose and sometimes treat conditions that affect the lower urinary tract. The lower urinary tract includes the bladder and the urethra. The urethra is the tube that drains urine from the bladder. Cystoscopy is done using a thin, tube-shaped instrument with a light and camera at the end (cystoscope). The cystoscope may be hard or flexible, depending on the goal of the procedure. The cystoscope is inserted through the urethra, into the bladder. Cystoscopy may be recommended if you have: Urinary tract infections that keep coming back. Blood in the urine (hematuria). An inability to control when you urinate (urinary incontinence) or an overactive bladder. Unusual cells found in a urine sample. A blockage in the urethra, such as a urinary stone. Painful urination. An abnormality in the bladder found during an intravenous pyelogram (IVP) or CT scan. Cystoscopy may also be done to remove a sample of tissue to be examined under a microscope (biopsy). What are the risks? Generally, this is a safe procedure. However, problems may occur, including: Infection. Bleeding.  What happens during the procedure?  You will be given one or more of the following: A medicine to numb the area (local anesthetic). The area around the opening of your urethra will be cleaned. The cystoscope will be passed through your urethra into your bladder. Germ-free (sterile) fluid will flow through the cystoscope to fill your bladder. The fluid will stretch your bladder so that your health care provider can clearly examine your bladder walls. Your doctor will look at the urethra and bladder. The cystoscope will be removed The procedure may vary among health care providers  What can I expect after the procedure? After the procedure, it is common to have: Some soreness or pain in your abdomen and urethra. Urinary symptoms. These include: Mild pain or burning when you  urinate. Pain should stop within a few minutes after you urinate. This may last for up to 1 week. A small amount of blood in your urine for several days. Feeling like you need to urinate but producing only a small amount of urine. Follow these instructions at home: General instructions Return to your normal activities as told by your health care provider.  Do not drive for 24 hours if you were given a sedative during your procedure. Watch for any blood in your urine. If the amount of blood in your urine increases, call your health care provider. If a tissue sample was removed for testing (biopsy) during your procedure, it is up to you to get your test results. Ask your health care provider, or the department that is doing the test, when your results will be ready. Drink enough fluid to keep your urine pale yellow. Keep all follow-up visits as told by your health care provider. This is important. Contact a health care provider if you: Have pain that gets worse or does not get better with medicine, especially pain when you urinate. Have trouble urinating. Have more blood in your urine. Get help right away if you: Have blood clots in your urine. Have abdominal pain. Have a fever or chills. Are unable to urinate. Summary Cystoscopy is a procedure that is used to help diagnose and sometimes treat conditions that affect the lower urinary tract. Cystoscopy is done using a thin, tube-shaped instrument with a light and camera at the end. After the procedure, it is common to have some soreness or pain in your abdomen and urethra. Watch for any blood in your urine.   If the amount of blood in your urine increases, call your health care provider. If you were prescribed an antibiotic medicine, take it as told by your health care provider. Do not stop taking the antibiotic even if you start to feel better. This information is not intended to replace advice given to you by your health care provider. Make  sure you discuss any questions you have with your health care provider. Document Revised: 11/11/2018 Document Reviewed: 11/11/2018 Elsevier Patient Education  2020 Elsevier Inc.  

## 2021-07-13 LAB — URINALYSIS, COMPLETE
Bilirubin, UA: NEGATIVE
Glucose, UA: NEGATIVE
Ketones, UA: NEGATIVE
Leukocytes,UA: NEGATIVE
Nitrite, UA: NEGATIVE
Protein,UA: NEGATIVE
Specific Gravity, UA: 1.03 (ref 1.005–1.030)
Urobilinogen, Ur: 0.2 mg/dL (ref 0.2–1.0)
pH, UA: 5 (ref 5.0–7.5)

## 2021-07-13 LAB — MICROSCOPIC EXAMINATION: Epithelial Cells (non renal): >10 /hpf — AB (ref 0–10)

## 2021-08-01 ENCOUNTER — Ambulatory Visit
Admission: RE | Admit: 2021-08-01 | Discharge: 2021-08-01 | Disposition: A | Discharge status: Home / Self Care | Payer: BC Managed Care – PPO | Source: Ambulatory Visit | Attending: Family Medicine | Admitting: Family Medicine

## 2021-08-01 ENCOUNTER — Other Ambulatory Visit: Payer: Self-pay

## 2021-08-01 ENCOUNTER — Ambulatory Visit (INDEPENDENT_AMBULATORY_CARE_PROVIDER_SITE_OTHER): Payer: BC Managed Care – PPO | Admitting: Obstetrics and Gynecology

## 2021-08-01 ENCOUNTER — Encounter: Payer: Self-pay | Admitting: Obstetrics and Gynecology

## 2021-08-01 VITALS — BP 140/60 | Ht 64.0 in | Wt 177.0 lb

## 2021-08-01 DIAGNOSIS — Z1231 Encounter for screening mammogram for malignant neoplasm of breast: Secondary | ICD-10-CM | POA: Insufficient documentation

## 2021-08-01 DIAGNOSIS — N951 Menopausal and female climacteric states: Secondary | ICD-10-CM

## 2021-08-01 DIAGNOSIS — Z01419 Encounter for gynecological examination (general) (routine) without abnormal findings: Secondary | ICD-10-CM

## 2021-08-01 MED ORDER — NORETHINDRONE ACETATE 5 MG PO TABS: 2.5000 mg | ORAL_TABLET | Freq: Every day | ORAL | 3 refills | Status: DC

## 2021-08-01 MED ORDER — ESTRADIOL 1 MG PO TABS: 1.0000 mg | ORAL_TABLET | Freq: Every day | ORAL | 3 refills | Status: DC

## 2021-08-01 NOTE — Patient Instructions (Signed)
I value your feedback and you entrusting us with your care. If you get a [facility] patient survey, I would appreciate you taking the time to let us know about your experience today. Thank you! ? ? ?

## 2021-08-01 NOTE — Progress Notes (Signed)
   08/02/21  CC:  Chief Complaint  Patient presents with   Cysto     HPI: Patricia Callahan is a  60 y.o.female  who returns today for cystoscopy.   She has a  history of microscopic hematuria. Last cystoscopy was in Aug 2020 which was unremarkable.  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  She has been doing well from that standpoint.  She also has a history of recurrent UTIs.   07/12/2021 UA showed 3+ RBCs but was otherwise unremarkable.    Vitals:   08/02/21 1555  BP: (!) 148/84  Pulse: 82   NED. A&Ox3.   No respiratory distress   Abd soft, NT, ND Normal external genitalia with patent urethral meatus  Cystoscopy Procedure Note  Patient identification was confirmed, informed consent was obtained, and patient was prepped using Betadine solution.  Lidocaine jelly was administered per urethral meatus.    Procedure: - Flexible cystoscope introduced, without any difficulty.   - Thorough search of the bladder revealed:    normal urethral meatus    normal urothelium    no stones    no ulcers     no tumors    no urethral polyps    no trabeculation  - Ureteral orifices were normal in position and appearance.  Post-Procedure: - Patient tolerated the procedure well  Assessment/ Plan:  History of recurrent UTI  - asymptomatic today  - Adequate emptying of bladder - UA revealed microscopic hematuria   Microscopic hematuria  - Persistent   -Plan to repeat cystoscopy given that 2 years since her last scope to reevaluate her bladder -Hold off on further upper tract imaging given that she had multiple CTs including a negative CT urogram 2 years ago, may consider alternative imaging down the road/renal ultrasound if she continues to have persistent blood or worsening hematuria - cystoscopy unremarkable  today   I,Kailey Littlejohn,acting as a scribe for Hollice Espy, MD.,have documented all relevant documentation on the behalf of Hollice Espy, MD,as directed by  Hollice Espy, MD while in the presence of Hollice Espy, MD.  I have reviewed the above documentation for accuracy and completeness, and I agree with the above.   Hollice Espy, MD

## 2021-08-01 NOTE — Progress Notes (Signed)
PCP: Maryland Pink, MD   Chief Complaint  Patient presents with   Gynecologic Exam    No concerns    HPI:      Patricia Callahan is a 60 y.o. G0P0000 whose LMP was No LMP recorded. Patient is postmenopausal., presents today for her annual examination.  Her menses are absent due to menopause, no PMB. No vasomotor sx on HRT. Takes estradiol 1 mg daily and norethindrone 2.5 mg (1/2 tab) daily.   Sex activity: single partner, contraception - post menopausal status. She does not have vaginal dryness.   Last Pap: 03/28/18  Results were: no abnormalities /neg HPV DNA.   Last mammogram: 06/15/20 Results were: normal--routine follow-up in 12 months. Has appt today. There is no FH of breast cancer. There is no FH of ovarian cancer. The patient does do self-breast exams.  Colonoscopy: 2020  Repeat due after 3 years.  Followed by GI for Crohns vs inflammation, although no evidence of Crohns. Seeing [facility].  Tobacco use: The patient denies current or previous tobacco use. Alcohol use: none Exercise: moderately active  She does get adequate calcium and Vitamin D in her diet.  Labs with PCP.   Past Medical History:  Diagnosis Date   Crohn's disease ([place])    External hemorrhoid    GERD (gastroesophageal reflux disease)    OCC TUMS PRN   Hypercholesteremia    Hypertension    Plantar fasciitis of left foot    Vasomotor symptoms due to menopause     Past Surgical History:  Procedure Laterality Date   COLONOSCOPY  2015   normal, was advised to return in 10 years   COLONOSCOPY WITH PROPOFOL N/A 09/10/2019   Procedure: COLONOSCOPY WITH PROPOFOL;  Surgeon: Jonathon Bellows, MD;  Location: [facility] ENDOSCOPY;  Service: Gastroenterology;  Laterality: N/A;   COLOSTOMY REVISION  10/08/2019   Procedure: COLON RESECTION RIGHT;  Surgeon: Benjamine Sprague, DO;  Location: [facility] ORS;  Service: General;;   LAPAROSCOPIC RIGHT COLECTOMY Right 10/08/2019   Procedure: ATTEMPTED LAPAROSCOPIC RIGHT COLECTOMY;  Surgeon:  Benjamine Sprague, DO;  Location: [facility] ORS;  Service: General;  Laterality: Right;   PLANTAR FASCIA RELEASE Left 04/28/2020   Procedure: ENDOSCOPIC PLANTAR FASCIA RELEASE WITH TOPAZ LEFT;  Surgeon: Albertine Patricia, DPM;  Location: [place];  Service: Podiatry;  Laterality: Left;  LMA WITH POP BLOCK    Family History  Problem Relation Age of Onset   Diabetes Mother    Hypertension Mother    Hyperlipidemia Mother    Hypertension Father    Hyperlipidemia Father    Dementia Father    Prostate cancer Father    Heart disease Father    Breast cancer Neg Hx     Social History   Socioeconomic History   Marital status: Widowed    Spouse name: Not on file   Number of children: 0   Years of education: 14   Highest education level: Not on file  Occupational History   Not on file  Tobacco Use   Smoking status: Never   Smokeless tobacco: Never  Vaping Use   Vaping Use: Never used  Substance and Sexual Activity   Alcohol use: Yes    Comment: SOCIAL   Drug use: No   Sexual activity: Yes    Birth control/protection: None  Other Topics Concern   Not on file  Social History Narrative   Not on file   Social Determinants of Health   Financial Resource Strain: Not on file  Food Insecurity: Not on file  Transportation Needs: Not on file  Physical Activity: Not on file  Stress: Not on file  Social Connections: Not on file  Intimate Partner Violence: Not on file     Current Outpatient Medications:    Ascorbic Acid (VITAMIN C) 1000 MG tablet, Take 1,000 mg by mouth daily., Disp: , Rfl:    calcium carbonate (TUMS - DOSED IN MG ELEMENTAL CALCIUM) 500 MG chewable tablet, Chew 1 tablet by mouth as needed for indigestion or heartburn., Disp: , Rfl:    Cholecalciferol (VITAMIN D3) 50 MCG (2000 UT) TABS, Take 2,000 Units by mouth daily., Disp: , Rfl:    fexofenadine (ALLEGRA) 180 MG tablet, Take 180 mg by mouth daily., Disp: , Rfl:    meloxicam (MOBIC) 15 MG tablet, Take 15 mg by  mouth daily., Disp: , Rfl:    Multiple Vitamin (MULTIVITAMIN WITH MINERALS) TABS tablet, Take 1 tablet by mouth daily., Disp: , Rfl:    nystatin (MYCOSTATIN/NYSTOP) powder, Apply topically daily., Disp: , Rfl:    Probiotic Product (DIGESTIVE ADVANTAGE GUMMIES) CHEW, Chew by mouth., Disp: , Rfl:    simvastatin (ZOCOR) 20 MG tablet, Take 20 mg by mouth at bedtime. , Disp: , Rfl:    vitamin B-12 (CYANOCOBALAMIN) 1000 MCG tablet, Take 1,000 mcg by mouth daily., Disp: , Rfl:    estradiol (ESTRACE) 1 MG tablet, Take 1 tablet (1 mg total) by mouth daily., Disp: 90 tablet, Rfl: 3   metoprolol succinate (TOPROL-XL) 25 MG 24 hr tablet, Take by mouth., Disp: , Rfl:    norethindrone (AYGESTIN) 5 MG tablet, Take 0.5 tablets (2.5 mg total) by mouth daily., Disp: 45 tablet, Rfl: 3     ROS:  Review of Systems  Constitutional:  Negative for fatigue, fever and unexpected weight change.  Respiratory:  Negative for cough, shortness of breath and wheezing.   Cardiovascular:  Negative for chest pain, palpitations and leg swelling.  Gastrointestinal:  Negative for blood in stool, constipation, diarrhea, nausea and vomiting.  Endocrine: Negative for cold intolerance, heat intolerance and polyuria.  Genitourinary:  Negative for dyspareunia, dysuria, flank pain, frequency, genital sores, hematuria, menstrual problem, pelvic pain, urgency, vaginal bleeding, vaginal discharge and vaginal pain.  Musculoskeletal:  Negative for back pain, joint swelling and myalgias.  Skin:  Negative for rash.  Neurological:  Negative for dizziness, syncope, light-headedness, numbness and headaches.  Hematological:  Negative for adenopathy.  Psychiatric/Behavioral:  Negative for agitation, confusion, sleep disturbance and suicidal ideas. The patient is not nervous/anxious.   BREAST: No symptoms    Objective: BP 140/60   Ht '5\' 4"'$  (1.626 m)   Wt 177 lb (80.3 kg)   BMI 30.38 kg/m    Physical Exam Constitutional:       Appearance: She is well-developed.  Genitourinary:     Vulva normal.     Right Labia: No rash, tenderness or lesions.    Left Labia: No tenderness, lesions or rash.    No vaginal discharge, erythema or tenderness.      Right Adnexa: not tender and no mass present.    Left Adnexa: not tender and no mass present.    No cervical friability or polyp.     Uterus is not enlarged or tender.  Breasts:    Right: No mass, nipple discharge, skin change or tenderness.     Left: No mass, nipple discharge, skin change or tenderness.  Neck:     Thyroid: No thyromegaly.  Cardiovascular:     Rate  and Rhythm: Normal rate and regular rhythm.     Heart sounds: Normal heart sounds. No murmur heard. Pulmonary:     Effort: Pulmonary effort is normal.     Breath sounds: Normal breath sounds.  Abdominal:     Palpations: Abdomen is soft.     Tenderness: There is no abdominal tenderness. There is no guarding or rebound.  Musculoskeletal:        General: Normal range of motion.     Cervical back: Normal range of motion.  Lymphadenopathy:     Cervical: No cervical adenopathy.  Neurological:     General: No focal deficit present.     Mental Status: She is alert and oriented to person, place, and time.     Cranial Nerves: No cranial nerve deficit.  Skin:    General: Skin is warm and dry.  Psychiatric:        Mood and Affect: Mood normal.        Behavior: Behavior normal.        Thought Content: Thought content normal.        Judgment: Judgment normal.  Vitals reviewed.    Assessment/Plan:  Encounter for annual routine gynecological examination  Encounter for screening mammogram for malignant neoplasm of breast; pt has mammo today  Vasomotor symptoms due to menopause - Plan: norethindrone (AYGESTIN) 5 MG tablet, estradiol (ESTRACE) 1 MG tablet; RX RF HRT. Will discuss weaning down next yr.    Meds ordered this encounter  Medications   norethindrone (AYGESTIN) 5 MG tablet    Sig: Take 0.5  tablets (2.5 mg total) by mouth daily.    Dispense:  45 tablet    Refill:  3    Order Specific Question:   Supervising Provider    Answer:   Gae Dry [id number]   estradiol (ESTRACE) 1 MG tablet    Sig: Take 1 tablet (1 mg total) by mouth daily.    Dispense:  90 tablet    Refill:  3    Order Specific Question:   Supervising Provider    Answer:   Gae Dry [id number]            GYN counsel breast self exam, mammography screening, menopause, adequate intake of calcium and vitamin D, diet and exercise    F/U  Return in about 1 year (around 08/01/2022).  Patricia Mcclees B. Aibhlinn Kalmar, PA-C 08/01/2021 2:25 PM

## 2021-08-02 ENCOUNTER — Ambulatory Visit (INDEPENDENT_AMBULATORY_CARE_PROVIDER_SITE_OTHER): Payer: BC Managed Care – PPO | Admitting: Urology

## 2021-08-02 ENCOUNTER — Encounter: Payer: Self-pay | Admitting: Urology

## 2021-08-02 VITALS — BP 148/84 | HR 82 | Ht 64.0 in | Wt 177.0 lb

## 2021-08-02 DIAGNOSIS — R3129 Other microscopic hematuria: Secondary | ICD-10-CM

## 2021-08-04 LAB — URINALYSIS, COMPLETE
Bilirubin, UA: NEGATIVE
Glucose, UA: NEGATIVE
Leukocytes,UA: NEGATIVE
Nitrite, UA: NEGATIVE
Protein,UA: NEGATIVE
Specific Gravity, UA: >1.03 — ABNORMAL HIGH (ref 1.005–1.030)
Urobilinogen, Ur: 0.2 mg/dL (ref 0.2–1.0)
pH, UA: 5.5 (ref 5.0–7.5)

## 2021-08-04 LAB — MICROSCOPIC EXAMINATION

## 2022-01-04 DIAGNOSIS — Z0000 Encounter for general adult medical examination without abnormal findings: Secondary | ICD-10-CM | POA: Diagnosis not present

## 2022-01-04 DIAGNOSIS — R8290 Unspecified abnormal findings in urine: Secondary | ICD-10-CM | POA: Diagnosis not present

## 2022-01-04 DIAGNOSIS — E785 Hyperlipidemia, unspecified: Secondary | ICD-10-CM | POA: Diagnosis not present

## 2022-01-04 DIAGNOSIS — R635 Abnormal weight gain: Secondary | ICD-10-CM | POA: Diagnosis not present

## 2022-01-04 DIAGNOSIS — I10 Essential (primary) hypertension: Secondary | ICD-10-CM | POA: Diagnosis not present

## 2022-04-03 DIAGNOSIS — Z85828 Personal history of other malignant neoplasm of skin: Secondary | ICD-10-CM | POA: Diagnosis not present

## 2022-04-03 DIAGNOSIS — D2261 Melanocytic nevi of right upper limb, including shoulder: Secondary | ICD-10-CM | POA: Diagnosis not present

## 2022-04-03 DIAGNOSIS — D2262 Melanocytic nevi of left upper limb, including shoulder: Secondary | ICD-10-CM | POA: Diagnosis not present

## 2022-04-03 DIAGNOSIS — D2272 Melanocytic nevi of left lower limb, including hip: Secondary | ICD-10-CM | POA: Diagnosis not present

## 2022-05-01 DIAGNOSIS — M79605 Pain in left leg: Secondary | ICD-10-CM | POA: Diagnosis not present

## 2022-05-01 DIAGNOSIS — M25472 Effusion, left ankle: Secondary | ICD-10-CM | POA: Diagnosis not present

## 2022-05-01 DIAGNOSIS — M79672 Pain in left foot: Secondary | ICD-10-CM | POA: Diagnosis not present

## 2022-05-01 DIAGNOSIS — S82831A Other fracture of upper and lower end of right fibula, initial encounter for closed fracture: Secondary | ICD-10-CM | POA: Diagnosis not present

## 2022-05-07 DIAGNOSIS — S82831A Other fracture of upper and lower end of right fibula, initial encounter for closed fracture: Secondary | ICD-10-CM | POA: Diagnosis not present

## 2022-05-21 DIAGNOSIS — S82831A Other fracture of upper and lower end of right fibula, initial encounter for closed fracture: Secondary | ICD-10-CM | POA: Diagnosis not present

## 2022-06-11 DIAGNOSIS — S82831A Other fracture of upper and lower end of right fibula, initial encounter for closed fracture: Secondary | ICD-10-CM | POA: Diagnosis not present

## 2022-06-26 ENCOUNTER — Other Ambulatory Visit: Payer: Self-pay | Admitting: Family Medicine

## 2022-06-26 DIAGNOSIS — Z1231 Encounter for screening mammogram for malignant neoplasm of breast: Secondary | ICD-10-CM

## 2022-07-27 DIAGNOSIS — K529 Noninfective gastroenteritis and colitis, unspecified: Secondary | ICD-10-CM | POA: Diagnosis not present

## 2022-08-09 ENCOUNTER — Other Ambulatory Visit: Payer: Self-pay | Admitting: Obstetrics and Gynecology

## 2022-08-09 ENCOUNTER — Ambulatory Visit
Admission: RE | Admit: 2022-08-09 | Discharge: 2022-08-09 | Disposition: A | Discharge status: Home / Self Care | Payer: 59 | Source: Ambulatory Visit | Attending: Family Medicine | Admitting: Family Medicine

## 2022-08-09 DIAGNOSIS — Z1231 Encounter for screening mammogram for malignant neoplasm of breast: Secondary | ICD-10-CM | POA: Diagnosis not present

## 2022-08-09 DIAGNOSIS — N951 Menopausal and female climacteric states: Secondary | ICD-10-CM

## 2022-08-30 DIAGNOSIS — M722 Plantar fascial fibromatosis: Secondary | ICD-10-CM | POA: Diagnosis not present

## 2022-08-31 DIAGNOSIS — Z23 Encounter for immunization: Secondary | ICD-10-CM | POA: Diagnosis not present

## 2022-09-29 ENCOUNTER — Other Ambulatory Visit: Payer: Self-pay | Admitting: Obstetrics and Gynecology

## 2022-09-29 DIAGNOSIS — N951 Menopausal and female climacteric states: Secondary | ICD-10-CM

## 2022-10-01 ENCOUNTER — Encounter (INDEPENDENT_AMBULATORY_CARE_PROVIDER_SITE_OTHER): Payer: Self-pay

## 2022-10-01 ENCOUNTER — Other Ambulatory Visit: Payer: Self-pay | Admitting: Obstetrics and Gynecology

## 2022-10-01 DIAGNOSIS — N951 Menopausal and female climacteric states: Secondary | ICD-10-CM

## 2022-10-01 MED ORDER — ESTRADIOL 1 MG PO TABS: 1.0000 mg | ORAL_TABLET | Freq: Every day | ORAL | 0 refills | Status: DC

## 2022-10-01 NOTE — Progress Notes (Signed)
Rx RF HRT till annual 11/23

## 2022-10-31 NOTE — Progress Notes (Unsigned)
PCP: Maryland Pink, MD   No chief complaint on file.   HPI:      [name] is a 61 y.o. G0P0000 whose LMP was No LMP recorded. Patient is postmenopausal., presents today for her annual examination.  Her menses are absent due to menopause, no PMB. No vasomotor sx on HRT. Takes estradiol 1 mg daily and norethindrone 2.5 mg (1/2 tab) daily.   Sex activity: single partner, contraception - post menopausal status. She does not have vaginal dryness.   Last Pap: 03/28/18  Results were: no abnormalities /neg HPV DNA.   Last mammogram: 08/09/22 Results were: normal--routine follow-up in 12 months.  There is no FH of breast cancer. There is no FH of ovarian cancer. The patient does do self-breast exams.  Colonoscopy: 2020  with polyp;vRepeat due after 5 years.  Followed by GI for Crohns vs inflammation, although no evidence of Crohns. Seeing [facility].  Tobacco use: The patient denies current or previous tobacco use. Alcohol use: none Exercise: moderately active  She does get adequate calcium and Vitamin D in her diet.  Labs with PCP.   Past Medical History:  Diagnosis Date   Crohn's disease ([place])    External hemorrhoid    GERD (gastroesophageal reflux disease)    OCC TUMS PRN   Hypercholesteremia    Hypertension    Plantar fasciitis of left foot    Vasomotor symptoms due to menopause     Past Surgical History:  Procedure Laterality Date   COLONOSCOPY  2015   normal, was advised to return in 10 years   COLONOSCOPY WITH PROPOFOL N/A 09/10/2019   Procedure: COLONOSCOPY WITH PROPOFOL;  Surgeon: Jonathon Bellows, MD;  Location[facility] ENDOSCOPY;  Service: Gastroenterology;  Laterality: N/A;   COLOSTOMY REVISION  10/08/2019   Procedure: COLON RESECTION RIGHT;  Surgeon: Benjamine Sprague, DO;  Location: [facility] ORS;  Service: General;;   LAPAROSCOPIC RIGHT COLECTOMY Right 10/08/2019   Procedure: ATTEMPTED LAPAROSCOPIC RIGHT COLECTOMY;  Surgeon: Benjamine Sprague, DO;  Location: [facility] ORS;  Service:  General;  Laterality: Right;   PLANTAR FASCIA RELEASE Left 04/28/2020   Procedure: ENDOSCOPIC PLANTAR FASCIA RELEASE WITH TOPAZ LEFT;  Surgeon: Albertine Patricia, DPM;  Location: [place];  Service: Podiatry;  Laterality: Left;  LMA WITH POP BLOCK    Family History  Problem Relation Age of Onset   Diabetes Mother    Hypertension Mother    Hyperlipidemia Mother    Hypertension Father    Hyperlipidemia Father    Dementia Father    Prostate cancer Father    Heart disease Father    Breast cancer Neg Hx     Social History   Socioeconomic History   Marital status: Widowed    Spouse name: Not on file   Number of children: 0   Years of education: 14   Highest education level: Not on file  Occupational History   Not on file  Tobacco Use   Smoking status: Never   Smokeless tobacco: Never  Vaping Use   Vaping Use: Never used  Substance and Sexual Activity   Alcohol use: Yes    Comment: SOCIAL   Drug use: No   Sexual activity: Yes    Birth control/protection: None  Other Topics Concern   Not on file  Social History Narrative   Not on file   Social Determinants of Health   Financial Resource Strain: Not on file  Food Insecurity: Not on file  Transportation Needs: Not on file  Physical Activity: Inactive (03/28/2018)   Exercise Vital Sign    Days of Exercise per Week: 0 days    Minutes of Exercise per Session: 0 min  Stress: No Stress Concern Present (03/28/2018)   [place]    Feeling of Stress : Not at all  Social Connections: Not on file  Intimate Partner Violence: Not on file     Current Outpatient Medications:    Ascorbic Acid (VITAMIN C) 1000 MG tablet, Take 1,000 mg by mouth daily., Disp: , Rfl:    calcium carbonate (TUMS - DOSED IN MG ELEMENTAL CALCIUM) 500 MG chewable tablet, Chew 1 tablet by mouth as needed for indigestion or heartburn., Disp: , Rfl:    Cholecalciferol (VITAMIN D3) 50  MCG (2000 UT) TABS, Take 2,000 Units by mouth daily., Disp: , Rfl:    estradiol (ESTRACE) 1 MG tablet, Take 1 tablet (1 mg total) by mouth daily., Disp: 90 tablet, Rfl: 0   fexofenadine (ALLEGRA) 180 MG tablet, Take 180 mg by mouth daily., Disp: , Rfl:    meloxicam (MOBIC) 15 MG tablet, Take 15 mg by mouth daily., Disp: , Rfl:    metoprolol succinate (TOPROL-XL) 25 MG 24 hr tablet, Take by mouth., Disp: , Rfl:    Multiple Vitamin (MULTIVITAMIN WITH MINERALS) TABS tablet, Take 1 tablet by mouth daily., Disp: , Rfl:    norethindrone (AYGESTIN) 5 MG tablet, TAKE 1/2 TABLET BY MOUTH DAILY, Disp: 45 tablet, Rfl: 0   nystatin (MYCOSTATIN/NYSTOP) powder, Apply topically daily., Disp: , Rfl:    Probiotic Product (DIGESTIVE ADVANTAGE GUMMIES) CHEW, Chew by mouth., Disp: , Rfl:    simvastatin (ZOCOR) 20 MG tablet, Take 20 mg by mouth at bedtime. , Disp: , Rfl:    vitamin B-12 (CYANOCOBALAMIN) 1000 MCG tablet, Take 1,000 mcg by mouth daily., Disp: , Rfl:      ROS:  Review of Systems  Constitutional:  Negative for fatigue, fever and unexpected weight change.  Respiratory:  Negative for cough, shortness of breath and wheezing.   Cardiovascular:  Negative for chest pain, palpitations and leg swelling.  Gastrointestinal:  Negative for blood in stool, constipation, diarrhea, nausea and vomiting.  Endocrine: Negative for cold intolerance, heat intolerance and polyuria.  Genitourinary:  Negative for dyspareunia, dysuria, flank pain, frequency, genital sores, hematuria, menstrual problem, pelvic pain, urgency, vaginal bleeding, vaginal discharge and vaginal pain.  Musculoskeletal:  Negative for back pain, joint swelling and myalgias.  Skin:  Negative for rash.  Neurological:  Negative for dizziness, syncope, light-headedness, numbness and headaches.  Hematological:  Negative for adenopathy.  Psychiatric/Behavioral:  Negative for agitation, confusion, sleep disturbance and suicidal ideas. The patient is not  nervous/anxious.    BREAST: No symptoms    Objective: There were no vitals taken for this visit.   Physical Exam Constitutional:      Appearance: She is well-developed.  Genitourinary:     Vulva normal.     Right Labia: No rash, tenderness or lesions.    Left Labia: No tenderness, lesions or rash.    No vaginal discharge, erythema or tenderness.      Right Adnexa: not tender and no mass present.    Left Adnexa: not tender and no mass present.    No cervical friability or polyp.     Uterus is not enlarged or tender.  Breasts:    Right: No mass, nipple discharge, skin change or tenderness.     Left: No mass, nipple discharge, skin  change or tenderness.  Neck:     Thyroid: No thyromegaly.  Cardiovascular:     Rate and Rhythm: Normal rate and regular rhythm.     Heart sounds: Normal heart sounds. No murmur heard. Pulmonary:     Effort: Pulmonary effort is normal.     Breath sounds: Normal breath sounds.  Abdominal:     Palpations: Abdomen is soft.     Tenderness: There is no abdominal tenderness. There is no guarding or rebound.  Musculoskeletal:        General: Normal range of motion.     Cervical back: Normal range of motion.  Lymphadenopathy:     Cervical: No cervical adenopathy.  Neurological:     General: No focal deficit present.     Mental Status: She is alert and oriented to person, place, and time.     Cranial Nerves: No cranial nerve deficit.  Skin:    General: Skin is warm and dry.  Psychiatric:        Mood and Affect: Mood normal.        Behavior: Behavior normal.        Thought Content: Thought content normal.        Judgment: Judgment normal.  Vitals reviewed.     Assessment/Plan:  Encounter for annual routine gynecological examination  Encounter for screening mammogram for malignant neoplasm of breast; pt has mammo today  Vasomotor symptoms due to menopause - Plan: norethindrone (AYGESTIN) 5 MG tablet, estradiol (ESTRACE) 1 MG tablet; RX RF  HRT. Will discuss weaning down next yr.    No orders of the defined types were placed in this encounter.           GYN counsel breast self exam, mammography screening, menopause, adequate intake of calcium and vitamin D, diet and exercise    F/U  No follow-ups on file.  Tierrah Anastos B. Sonam Wandel, PA-C 10/31/2022 7:26 PM

## 2022-11-01 ENCOUNTER — Encounter: Payer: Self-pay | Admitting: Obstetrics and Gynecology

## 2022-11-01 ENCOUNTER — Other Ambulatory Visit (HOSPITAL_COMMUNITY)
Admission: RE | Admit: 2022-11-01 | Discharge: 2022-11-01 | Disposition: A | Discharge status: Home / Self Care | Payer: 59 | Source: Ambulatory Visit | Attending: Obstetrics and Gynecology | Admitting: Obstetrics and Gynecology

## 2022-11-01 ENCOUNTER — Ambulatory Visit (INDEPENDENT_AMBULATORY_CARE_PROVIDER_SITE_OTHER): Payer: 59 | Admitting: Obstetrics and Gynecology

## 2022-11-01 VITALS — BP 120/64 | Ht 64.0 in | Wt 183.0 lb

## 2022-11-01 DIAGNOSIS — Z1151 Encounter for screening for human papillomavirus (HPV): Secondary | ICD-10-CM | POA: Diagnosis not present

## 2022-11-01 DIAGNOSIS — Z113 Encounter for screening for infections with a predominantly sexual mode of transmission: Secondary | ICD-10-CM

## 2022-11-01 DIAGNOSIS — Z01419 Encounter for gynecological examination (general) (routine) without abnormal findings: Secondary | ICD-10-CM

## 2022-11-01 DIAGNOSIS — Z124 Encounter for screening for malignant neoplasm of cervix: Secondary | ICD-10-CM | POA: Diagnosis not present

## 2022-11-01 DIAGNOSIS — Z79890 Hormone replacement therapy (postmenopausal): Secondary | ICD-10-CM | POA: Diagnosis not present

## 2022-11-01 DIAGNOSIS — Z1231 Encounter for screening mammogram for malignant neoplasm of breast: Secondary | ICD-10-CM

## 2022-11-01 DIAGNOSIS — N951 Menopausal and female climacteric states: Secondary | ICD-10-CM

## 2022-11-01 MED ORDER — ESTRADIOL 0.5 MG PO TABS: 0.5000 mg | ORAL_TABLET | Freq: Every day | ORAL | 3 refills | Status: DC

## 2022-11-01 MED ORDER — NORETHINDRONE ACETATE 5 MG PO TABS: 2.5000 mg | ORAL_TABLET | Freq: Every day | ORAL | 3 refills | Status: DC

## 2022-11-01 NOTE — Patient Instructions (Signed)
I value your feedback and you entrusting us with your care. If you get a [place] patient survey, I would appreciate you taking the time to let us know about your experience today. Thank you! ? ? ?

## 2022-11-07 LAB — CYTOLOGY - PAP
Chlamydia: NEGATIVE
Comment: NEGATIVE
Comment: NEGATIVE
Comment: NORMAL
Diagnosis: NEGATIVE
High risk HPV: NEGATIVE
Neisseria Gonorrhea: NEGATIVE

## 2022-11-09 DIAGNOSIS — M722 Plantar fascial fibromatosis: Secondary | ICD-10-CM | POA: Diagnosis not present

## 2022-11-09 DIAGNOSIS — M7751 Other enthesopathy of right foot: Secondary | ICD-10-CM | POA: Diagnosis not present

## 2022-12-04 ENCOUNTER — Encounter: Payer: Self-pay | Admitting: Internal Medicine

## 2022-12-05 ENCOUNTER — Ambulatory Visit
Admission: RE | Admit: 2022-12-05 | Discharge: 2022-12-05 | Disposition: A | Discharge status: Home / Self Care | Payer: 59 | Attending: Internal Medicine | Admitting: Internal Medicine

## 2022-12-05 ENCOUNTER — Encounter
Admission: RE | Disposition: A | Discharge status: Home / Self Care | Payer: Self-pay | Source: Home / Self Care | Attending: Internal Medicine

## 2022-12-05 ENCOUNTER — Ambulatory Visit: Payer: 59 | Admitting: Anesthesiology

## 2022-12-05 ENCOUNTER — Encounter: Payer: Self-pay | Admitting: Internal Medicine

## 2022-12-05 DIAGNOSIS — K648 Other hemorrhoids: Secondary | ICD-10-CM | POA: Diagnosis not present

## 2022-12-05 DIAGNOSIS — E7800 Pure hypercholesterolemia, unspecified: Secondary | ICD-10-CM | POA: Insufficient documentation

## 2022-12-05 DIAGNOSIS — R140 Abdominal distension (gaseous): Secondary | ICD-10-CM | POA: Diagnosis not present

## 2022-12-05 DIAGNOSIS — K5090 Crohn's disease, unspecified, without complications: Secondary | ICD-10-CM | POA: Insufficient documentation

## 2022-12-05 DIAGNOSIS — K6389 Other specified diseases of intestine: Secondary | ICD-10-CM | POA: Diagnosis not present

## 2022-12-05 DIAGNOSIS — R933 Abnormal findings on diagnostic imaging of other parts of digestive tract: Secondary | ICD-10-CM | POA: Diagnosis not present

## 2022-12-05 DIAGNOSIS — Z791 Long term (current) use of non-steroidal anti-inflammatories (NSAID): Secondary | ICD-10-CM | POA: Insufficient documentation

## 2022-12-05 DIAGNOSIS — K642 Third degree hemorrhoids: Secondary | ICD-10-CM | POA: Diagnosis not present

## 2022-12-05 DIAGNOSIS — I10 Essential (primary) hypertension: Secondary | ICD-10-CM | POA: Diagnosis not present

## 2022-12-05 DIAGNOSIS — K219 Gastro-esophageal reflux disease without esophagitis: Secondary | ICD-10-CM | POA: Insufficient documentation

## 2022-12-05 DIAGNOSIS — K643 Fourth degree hemorrhoids: Secondary | ICD-10-CM | POA: Insufficient documentation

## 2022-12-05 DIAGNOSIS — Z980 Intestinal bypass and anastomosis status: Secondary | ICD-10-CM | POA: Diagnosis not present

## 2022-12-05 DIAGNOSIS — K9189 Other postprocedural complications and disorders of digestive system: Secondary | ICD-10-CM | POA: Diagnosis not present

## 2022-12-05 DIAGNOSIS — K56609 Unspecified intestinal obstruction, unspecified as to partial versus complete obstruction: Secondary | ICD-10-CM | POA: Diagnosis not present

## 2022-12-05 DIAGNOSIS — Z79899 Other long term (current) drug therapy: Secondary | ICD-10-CM | POA: Insufficient documentation

## 2022-12-05 DIAGNOSIS — K644 Residual hemorrhoidal skin tags: Secondary | ICD-10-CM | POA: Diagnosis not present

## 2022-12-05 HISTORY — PX: COLONOSCOPY: SHX5424

## 2022-12-05 SURGERY — COLONOSCOPY
Anesthesia: General

## 2022-12-05 MED ORDER — LIDOCAINE HCL (PF) 2 % IJ SOLN
INTRAMUSCULAR | Status: AC
  Filled 2022-12-05: qty 15

## 2022-12-05 MED ORDER — PROPOFOL 1000 MG/100ML IV EMUL
INTRAVENOUS | Status: AC
  Filled 2022-12-05: qty 200

## 2022-12-05 MED ORDER — SODIUM CHLORIDE 0.9 % IV SOLN: INTRAVENOUS | Status: DC

## 2022-12-05 MED ORDER — PROPOFOL 10 MG/ML IV BOLUS
INTRAVENOUS | Status: AC
  Filled 2022-12-05: qty 40

## 2022-12-05 MED ORDER — LIDOCAINE HCL (CARDIAC) PF 100 MG/5ML IV SOSY
PREFILLED_SYRINGE | INTRAVENOUS | Status: DC | PRN
  Administered 2022-12-05: 50 mg via INTRAVENOUS

## 2022-12-05 MED ORDER — PROPOFOL 10 MG/ML IV BOLUS
INTRAVENOUS | Status: DC | PRN
  Administered 2022-12-05: 40 mg via INTRAVENOUS
  Administered 2022-12-05: 100 mg via INTRAVENOUS
  Administered 2022-12-05: 50 mg via INTRAVENOUS
  Administered 2022-12-05 (×9): 40 mg via INTRAVENOUS

## 2022-12-05 NOTE — Transfer of Care (Signed)
Immediate Anesthesia Transfer of Care Note  Patient: Patricia Callahan  Procedure(s) Performed: COLONOSCOPY  Patient Location: Endoscopy Unit  Anesthesia Type:General  Level of Consciousness: drowsy  Airway & Oxygen Therapy: Patient Spontanous Breathing and Patient connected to nasal cannula oxygen  Post-op Assessment: Report given to RN, Post -op Vital signs reviewed and stable, and Patient moving all extremities  Post vital signs: Reviewed and stable  Last Vitals:  Vitals Value Taken Time  BP 143/81 12/05/22 0945  Temp 36.1 C 12/05/22 0945  Pulse 86 12/05/22 0945  Resp 20 12/05/22 0945  SpO2 98 % 12/05/22 0945    Last Pain:  Vitals:   12/05/22 0945  TempSrc: Temporal  PainSc: Asleep         Complications: No notable events documented.

## 2022-12-05 NOTE — Op Note (Signed)
[facility] Patient Name: Patricia Callahan Procedure Date: 12/05/2022 8:16 AM MRN: [id number] Account #: [id number] Date of Birth: 02-21-1961 Admit Type: Outpatient Age: 62 Room: [facility] ENDO ROOM 2 Gender: Female Note Status: [place] Instrument Name: Jasper Riling [id number] Procedure:             Colonoscopy Indications:           Chronic NSAID use, history of ileitis Providers:             Benay Pike. Alice Reichert MD, MD Referring MD:          Irven Easterly. 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 Propofol per                         Anesthesia Complications:         No immediate complications. Estimated blood loss:                         Minimal. Procedure:             Pre-Anesthesia Assessment:                        - The risks and benefits of the procedure and the                         sedation options and risks were discussed with the                         patient. All questions were answered and informed                         consent was obtained.                        - Patient identification and proposed procedure were                         verified prior to the procedure by the nurse. The                         procedure was verified in the procedure room.                        After obtaining informed consent, the colonoscope was                         passed under direct vision. Throughout the procedure,                         the patient's blood pressure, pulse, and oxygen                         saturations were monitored continuously. The  Colonoscope was introduced through the anus and                         advanced to the the ileocolonic anastomosis. The                         colonoscopy was technically difficult and complex due                         to significant looping and a tortuous colon.                          Successful completion of the procedure was aided by                         changing the patient to a supine position. The patient                         tolerated the procedure well. The quality of the bowel                         preparation was good. Ileocolonic anastomosis and                         neoterminal ileum were photographed. Findings:      The perianal exam findings include internal hemorrhoids that do not       return to the anal canal, thus continuously prolapsed (Grade IV).      Non-bleeding internal hemorrhoids were found during retroflexion. The       hemorrhoids were Grade III (internal hemorrhoids that prolapse but       require manual reduction).      There was evidence of a prior end-to-side ileo-colonic anastomosis in       the ascending colon. 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 The anastomosis could not be traversed. A TTS dilator was       passed through the scope. Dilation with a 12-13.5-15 mm colonic balloon       dilator was performed. The dilation site was examined following       endoscope reinsertion and showed mild mucosal disruption. Estimated       blood loss was minimal. Clear tear was noted, but scope could not pass.       No further dilation was attempted. This was biopsied with a cold forceps       for histology.      Normal mucosa was found in the entire colon. Biopsies for histology were       taken with a cold forceps from the random colon for evaluation of       microscopic colitis.      The terminal ileum appeared normal.      The exam was otherwise without abnormality. Impression:            - Internal hemorrhoids that do not return to the anal                         canal, thus continuously prolapsed (Grade IV) found on  perianal exam.                        - Non-bleeding internal hemorrhoids.                        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 Dilated. Biopsied.                        - Normal mucosa in the entire examined colon. Biopsied.                        - The examined portion of the ileum was normal.                        - The examination was otherwise normal. Recommendation:        - Patient has a contact number available for                         emergencies. The signs and symptoms of potential                         delayed complications were discussed with the patient.                         Return to normal activities tomorrow. Written                         discharge instructions were provided to the patient.                        - Resume previous diet.                        - Continue present medications.                        - No aspirin, ibuprofen, naproxen, or other                         non-steroidal anti-inflammatory drugs.                        - Await pathology results.                        - Return to physician assistant in 3 months.                        - Follow up with Laurine Blazer, PA-C at [facility]. [phone].                        - The findings and recommendations were discussed with  the patient. Procedure Code(s):     --- Professional ---                        [phone], Colonoscopy, flexible; with transendoscopic                         balloon dilation                        [id number], Colonoscopy, flexible; with biopsy, single or                         multiple Diagnosis Code(s):     --- Professional ---                        K64.3, Fourth degree hemorrhoids                        K91.89, Other postprocedural complications and                         disorders of digestive system CPT copyright 2022 American [employer] Association. All rights reserved. The codes documented in this report are preliminary and upon coder review may  be  revised to meet current compliance requirements. Efrain Sella MD, MD 12/05/2022 9:55:33 AM This report has been signed electronically. Number of Addenda: 0 Note Initiated On: 12/05/2022 8:16 AM Total Procedure Duration: 0 hours 32 minutes 4 seconds  Estimated Blood Loss:  Estimated blood loss was minimal. Estimated blood loss                         was minimal.      [facility]

## 2022-12-05 NOTE — H&P (Signed)
Outpatient short stay form Pre-procedure 12/05/2022 9:03 AM Patricia Callahan K. Patricia Callahan, M.D.  Primary Physician: [place] Pink, M.D.  Reason for visit:  Hx of abnormal colonoscopy, ileitis  History of present illness: 62 y/o female with history of  abnormal colonoscopy-extensive history .Colonoscopy December 2021 as above with mild active ileitis not suggestive of Crohn's disease. She was feeling well until developed a significant bloating in August 2023 for 1 month, denies diet changes, med changes, bowel changes, etc. Symptoms have now resolved. Given new recurrence of symptoms would like to arrange colonoscopy for evaluation. She will contact me if symptoms recur prior to colonoscopy. We will hold off on checking fecal calprotectin but could consider in future with any symptom recurrence.  I reviewed the risks (including bleeding, perforation, infection, anesthesia complications, cardiac/respiratory complications), benefits and alternatives of colonoscopy. Patient consents to proceed. Denies CP/SOB/blood thinners.     Current Facility-Administered Medications:    0.9 %  sodium chloride infusion, , Intravenous, Continuous, [place], Benay Pike, MD, Last Rate: 20 mL/hr at 12/05/22 3710, Continued from Pre-op at 12/05/22 0902  Medications Prior to Admission  Medication Sig Dispense Refill Last Dose   estradiol (ESTRACE) 0.5 MG tablet Take 1 tablet (0.5 mg total) by mouth daily. 90 tablet 3 12/04/2022   metoprolol succinate (TOPROL-XL) 25 MG 24 hr tablet Take by mouth.   12/05/2022   norethindrone (AYGESTIN) 5 MG tablet Take 0.5 tablets (2.5 mg total) by mouth daily. 45 tablet 3 12/04/2022   simvastatin (ZOCOR) 20 MG tablet Take 20 mg by mouth at bedtime.    12/04/2022   Ascorbic Acid (VITAMIN C) 1000 MG tablet Take 1,000 mg by mouth daily.      calcium carbonate (TUMS - DOSED IN MG ELEMENTAL CALCIUM) 500 MG chewable tablet Chew 1 tablet by mouth as needed for indigestion or heartburn.      Cholecalciferol  (VITAMIN D3) 50 MCG (2000 UT) TABS Take 2,000 Units by mouth daily.      fexofenadine (ALLEGRA) 180 MG tablet Take 180 mg by mouth daily.      meloxicam (MOBIC) 15 MG tablet Take 15 mg by mouth daily.      Multiple Vitamin (MULTIVITAMIN WITH MINERALS) TABS tablet Take 1 tablet by mouth daily.      nystatin (MYCOSTATIN/NYSTOP) powder Apply topically daily.      Probiotic Product (DIGESTIVE ADVANTAGE GUMMIES) CHEW Chew by mouth.      vitamin B-12 (CYANOCOBALAMIN) 1000 MCG tablet Take 1,000 mcg by mouth daily.        No Known Allergies   Past Medical History:  Diagnosis Date   Crohn's disease ([place])    External hemorrhoid    GERD (gastroesophageal reflux disease)    OCC TUMS PRN   Hypercholesteremia    Hypertension    Plantar fasciitis of left foot    Vasomotor symptoms due to menopause     Review of systems:  Otherwise negative.    Physical Exam  Gen: Alert, oriented. Appears stated age.  HEENT: [place]/AT. PERRLA. Lungs: CTA, no wheezes. CV: RR nl S1, S2. Abd: soft, benign, no masses. BS+ Ext: No edema. Pulses 2+    Planned procedures: Proceed with colonoscopy. The patient understands the nature of the planned procedure, indications, risks, alternatives and potential complications including but not limited to bleeding, infection, perforation, damage to internal organs and possible oversedation/side effects from anesthesia. The patient agrees and gives consent to proceed.  Please refer to procedure notes for findings, recommendations and patient disposition/instructions.  Patricia Callahan K. Patricia Callahan, M.D. Gastroenterology 12/05/2022  9:03 AM

## 2022-12-05 NOTE — Anesthesia Postprocedure Evaluation (Signed)
Anesthesia Post Note  Patient: Patricia Callahan  Procedure(s) Performed: COLONOSCOPY  Patient location during evaluation: PACU Anesthesia Type: General Level of consciousness: awake and awake and alert Pain management: satisfactory to patient Respiratory status: spontaneous breathing and respiratory function stable Cardiovascular status: stable Anesthetic complications: no  No notable events documented.   Last Vitals:  Vitals:   12/05/22 0955 12/05/22 1005  BP: (!) 143/81 (!) 148/96  Pulse: 74 73  Resp: 20 (!) 21  Temp:    SpO2: 98% 98%    Last Pain:  Vitals:   12/05/22 1005  TempSrc:   PainSc: 0-No pain                 VAN STAVEREN,Jayten Gabbard

## 2022-12-05 NOTE — Interval H&P Note (Signed)
History and Physical Interval Note:  12/05/2022 9:04 AM  Patricia Callahan  has presented today for surgery, with the diagnosis of Bloating (R14.0) Abnormal colonoscopy (R93.3).  The various methods of treatment have been discussed with the patient and family. After consideration of risks, benefits and other options for treatment, the patient has consented to  Procedure(s): COLONOSCOPY (N/A) as a surgical intervention.  The patient's history has been reviewed, patient examined, no change in status, stable for surgery.  I have reviewed the patient's chart and labs.  Questions were answered to the patient's satisfaction.     [place], [place]

## 2022-12-05 NOTE — Anesthesia Preprocedure Evaluation (Signed)
Anesthesia Evaluation  Patient identified by MRN, date of birth, ID band Patient awake    Reviewed: Allergy & Precautions, NPO status , Patient's Chart, lab work & pertinent test results  Airway Mallampati: II  TM Distance: >3 FB Neck ROM: full    Dental  (+) Teeth Intact   Pulmonary neg pulmonary ROS   Pulmonary exam normal breath sounds clear to auscultation       Cardiovascular Exercise Tolerance: Good hypertension, Pt. on medications negative cardio ROS Normal cardiovascular exam Rhythm:Regular Rate:Normal     Neuro/Psych negative neurological ROS  negative psych ROS   GI/Hepatic negative GI ROS, Neg liver ROS,GERD  Medicated,,  Endo/Other  negative endocrine ROS    Renal/GU negative Renal ROS  negative genitourinary   Musculoskeletal   Abdominal Normal abdominal exam  (+)   Peds negative pediatric ROS (+)  Hematology negative hematology ROS (+)   Anesthesia Other Findings Past Medical History: No date: Crohn's disease (HCC) No date: External hemorrhoid No date: GERD (gastroesophageal reflux disease)     Comment:  OCC TUMS PRN No date: Hypercholesteremia No date: Hypertension No date: Plantar fasciitis of left foot No date: Vasomotor symptoms due to menopause  Past Surgical History: No date: COLON SURGERY 2015: COLONOSCOPY     Comment:  normal, was advised to return in 10 years 09/10/2019: COLONOSCOPY WITH PROPOFOL; N/A     Comment:  Procedure: COLONOSCOPY WITH PROPOFOL;  Surgeon: Jonathon Bellows, MD;  Location: [facility];  Service:               Gastroenterology;  Laterality: N/A; 10/08/2019: COLOSTOMY REVISION     Comment:  Procedure: COLON RESECTION RIGHT;  Surgeon: Benjamine Sprague, DO;  Location: [facility];  Service: General;; 10/08/2019: LAPAROSCOPIC RIGHT COLECTOMY; Right     Comment:  Procedure: ATTEMPTED LAPAROSCOPIC RIGHT COLECTOMY;                Surgeon: Benjamine Sprague, DO;  Location: [facility];  Service:              General;  Laterality: Right; 04/28/2020: PLANTAR FASCIA RELEASE; Left     Comment:  Procedure: ENDOSCOPIC PLANTAR FASCIA RELEASE WITH TOPAZ               LEFT;  Surgeon: Albertine Patricia, DPM;  Location: [facility];  Service: Podiatry;  Laterality: Left;  LMA              WITH POP BLOCK  BMI    Body Mass Index: 30.55 kg/m      Reproductive/Obstetrics negative OB ROS                             Anesthesia Physical Anesthesia Plan  ASA: 2  Anesthesia Plan: General   Post-op Pain Management:    Induction: Intravenous  PONV Risk Score and Plan: Propofol infusion and TIVA  Airway Management Planned: Natural Airway  Additional Equipment:   Intra-op Plan:   Post-operative Plan:   Informed Consent: I have reviewed the patients History and Physical, chart, labs and discussed the procedure including the risks, benefits and alternatives for the proposed anesthesia with the patient or authorized representative who has indicated his/her understanding  and acceptance.     Dental Advisory Given  Plan Discussed with: CRNA and Surgeon  Anesthesia Plan Comments:        Anesthesia Quick Evaluation

## 2022-12-06 ENCOUNTER — Encounter: Payer: Self-pay | Admitting: Internal Medicine

## 2022-12-06 LAB — SURGICAL PATHOLOGY

## 2022-12-18 DIAGNOSIS — Z6830 Body mass index (BMI) 30.0-30.9, adult: Secondary | ICD-10-CM | POA: Diagnosis not present

## 2022-12-18 DIAGNOSIS — J0140 Acute pansinusitis, unspecified: Secondary | ICD-10-CM | POA: Diagnosis not present

## 2022-12-18 DIAGNOSIS — I10 Essential (primary) hypertension: Secondary | ICD-10-CM | POA: Diagnosis not present

## 2023-01-01 DIAGNOSIS — Z20822 Contact with and (suspected) exposure to covid-19: Secondary | ICD-10-CM | POA: Diagnosis not present

## 2023-01-01 DIAGNOSIS — R051 Acute cough: Secondary | ICD-10-CM | POA: Diagnosis not present

## 2023-01-01 DIAGNOSIS — U071 COVID-19: Secondary | ICD-10-CM | POA: Diagnosis not present

## 2023-01-01 DIAGNOSIS — Z6830 Body mass index (BMI) 30.0-30.9, adult: Secondary | ICD-10-CM | POA: Diagnosis not present

## 2023-01-10 DIAGNOSIS — R8290 Unspecified abnormal findings in urine: Secondary | ICD-10-CM | POA: Diagnosis not present

## 2023-01-10 DIAGNOSIS — E785 Hyperlipidemia, unspecified: Secondary | ICD-10-CM | POA: Diagnosis not present

## 2023-01-10 DIAGNOSIS — I10 Essential (primary) hypertension: Secondary | ICD-10-CM | POA: Diagnosis not present

## 2023-03-11 DIAGNOSIS — R140 Abdominal distension (gaseous): Secondary | ICD-10-CM | POA: Diagnosis not present

## 2023-03-11 DIAGNOSIS — K649 Unspecified hemorrhoids: Secondary | ICD-10-CM | POA: Diagnosis not present

## 2023-03-11 DIAGNOSIS — R1013 Epigastric pain: Secondary | ICD-10-CM | POA: Diagnosis not present

## 2023-06-10 DIAGNOSIS — R143 Flatulence: Secondary | ICD-10-CM | POA: Diagnosis not present

## 2023-06-10 DIAGNOSIS — K219 Gastro-esophageal reflux disease without esophagitis: Secondary | ICD-10-CM | POA: Diagnosis not present

## 2023-06-10 DIAGNOSIS — R1013 Epigastric pain: Secondary | ICD-10-CM | POA: Diagnosis not present

## 2023-08-06 DIAGNOSIS — M62830 Muscle spasm of back: Secondary | ICD-10-CM | POA: Diagnosis not present

## 2023-08-06 DIAGNOSIS — M5417 Radiculopathy, lumbosacral region: Secondary | ICD-10-CM | POA: Diagnosis not present

## 2023-08-06 DIAGNOSIS — M9903 Segmental and somatic dysfunction of lumbar region: Secondary | ICD-10-CM | POA: Diagnosis not present

## 2023-08-06 DIAGNOSIS — M5416 Radiculopathy, lumbar region: Secondary | ICD-10-CM | POA: Diagnosis not present

## 2023-08-07 DIAGNOSIS — M62830 Muscle spasm of back: Secondary | ICD-10-CM | POA: Diagnosis not present

## 2023-08-07 DIAGNOSIS — M5416 Radiculopathy, lumbar region: Secondary | ICD-10-CM | POA: Diagnosis not present

## 2023-08-07 DIAGNOSIS — M9903 Segmental and somatic dysfunction of lumbar region: Secondary | ICD-10-CM | POA: Diagnosis not present

## 2023-08-07 DIAGNOSIS — M5417 Radiculopathy, lumbosacral region: Secondary | ICD-10-CM | POA: Diagnosis not present

## 2023-08-08 DIAGNOSIS — M533 Sacrococcygeal disorders, not elsewhere classified: Secondary | ICD-10-CM | POA: Diagnosis not present

## 2023-08-08 DIAGNOSIS — M5416 Radiculopathy, lumbar region: Secondary | ICD-10-CM | POA: Diagnosis not present

## 2023-08-08 DIAGNOSIS — M62830 Muscle spasm of back: Secondary | ICD-10-CM | POA: Diagnosis not present

## 2023-08-08 DIAGNOSIS — M5417 Radiculopathy, lumbosacral region: Secondary | ICD-10-CM | POA: Diagnosis not present

## 2023-08-08 DIAGNOSIS — M9903 Segmental and somatic dysfunction of lumbar region: Secondary | ICD-10-CM | POA: Diagnosis not present

## 2023-08-08 DIAGNOSIS — M545 Low back pain, unspecified: Secondary | ICD-10-CM | POA: Diagnosis not present

## 2023-08-09 ENCOUNTER — Other Ambulatory Visit: Payer: Self-pay

## 2023-08-09 ENCOUNTER — Encounter (HOSPITAL_COMMUNITY): Payer: Self-pay

## 2023-08-09 ENCOUNTER — Telehealth (HOSPITAL_COMMUNITY): Payer: Self-pay | Admitting: Emergency Medicine

## 2023-08-09 ENCOUNTER — Emergency Department (HOSPITAL_COMMUNITY)
Admission: EM | Admit: 2023-08-09 | Discharge: 2023-08-09 | Disposition: A | Discharge status: Home / Self Care | Payer: 59 | Attending: Emergency Medicine | Admitting: Emergency Medicine

## 2023-08-09 DIAGNOSIS — M5431 Sciatica, right side: Secondary | ICD-10-CM | POA: Diagnosis not present

## 2023-08-09 DIAGNOSIS — M5441 Lumbago with sciatica, right side: Secondary | ICD-10-CM | POA: Diagnosis not present

## 2023-08-09 DIAGNOSIS — Z79899 Other long term (current) drug therapy: Secondary | ICD-10-CM | POA: Diagnosis not present

## 2023-08-09 DIAGNOSIS — I10 Essential (primary) hypertension: Secondary | ICD-10-CM | POA: Diagnosis not present

## 2023-08-09 DIAGNOSIS — M545 Low back pain, unspecified: Secondary | ICD-10-CM | POA: Diagnosis not present

## 2023-08-09 MED ORDER — HYDROCODONE-ACETAMINOPHEN 5-325 MG PO TABS: 1.0000 | ORAL_TABLET | Freq: Four times a day (QID) | ORAL | 0 refills | Status: AC | PRN

## 2023-08-09 MED ORDER — HYDROCODONE-ACETAMINOPHEN 5-325 MG PO TABS: 1.0000 | ORAL_TABLET | Freq: Four times a day (QID) | ORAL | 0 refills | Status: DC | PRN

## 2023-08-09 MED ORDER — DEXAMETHASONE SODIUM PHOSPHATE 10 MG/ML IJ SOLN
10.0000 mg | Freq: Once | INTRAMUSCULAR | Status: AC
  Administered 2023-08-09: 10 mg via INTRAVENOUS
  Filled 2023-08-09: qty 1

## 2023-08-09 MED ORDER — HYDROMORPHONE HCL 1 MG/ML IJ SOLN
0.5000 mg | Freq: Once | INTRAMUSCULAR | Status: AC
  Administered 2023-08-09: 0.5 mg via INTRAVENOUS
  Filled 2023-08-09: qty 0.5

## 2023-08-09 MED ORDER — ONDANSETRON HCL 4 MG/2ML IJ SOLN
4.0000 mg | Freq: Once | INTRAMUSCULAR | Status: AC
Start: 2023-08-09 — End: 2023-08-09
  Administered 2023-08-09: 4 mg via INTRAVENOUS
  Filled 2023-08-09: qty 2

## 2023-08-09 NOTE — ED Triage Notes (Signed)
Pt reports mild lower right back pain x 2 weeks. Reports she saw a chiropractor twice and is worse since they "gave me an adjustment".  Pt reports now she has pain shooting down her right hip and leg and is having trouble walking.

## 2023-08-09 NOTE — ED Provider Notes (Signed)
[facility] EMERGENCY DEPARTMENT AT [facility] Provider Note   CSN: [id number] Arrival date & time: 08/09/23  1239     History  Chief Complaint  Patient presents with   Back Pain    Patricia Callahan is a 62 y.o. female with a history including hypertension, Crohn's disease, hypercholesterolemia and intermittent problems with mild low back pain presenting for evaluation of significant pain across her lumbar spine with radiation of pain into her right lateral lower thigh and knee area.  Her pain started about 2 weeks ago.  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  She was seen by a chiropractor and had 2 adjustments after which her pain worsened and now has radicular pain per above.  She denies weakness in her legs has had no urinary or fecal incontinence or retention, she denies fevers or chills.  She was seen at [facility] in [place] earlier this week where plain film images were reported negative, she was placed on prednisone, tramadol and Flexeril and has had minimal improvement, although she states she has not yet taken her first dose of prednisone.  The history is provided by the patient.       Home Medications Prior to Admission medications   Medication Sig Start Date End Date Taking? Authorizing Provider  HYDROcodone-acetaminophen (NORCO/VICODIN) 5-325 MG tablet Take 1 tablet by mouth every 6 (six) hours as needed. 08/09/23  Yes Eileen Kangas, Raynelle Fanning, PA-C  Ascorbic Acid (VITAMIN C) 1000 MG tablet Take 1,000 mg by mouth daily.    [provider]  calcium carbonate (TUMS - DOSED IN MG ELEMENTAL CALCIUM) 500 MG chewable tablet Chew 1 tablet by mouth as needed for indigestion or heartburn.    [provider]  Cholecalciferol (VITAMIN D3) 50 MCG (2000 UT) TABS Take 2,000 Units by mouth daily.    [provider]  estradiol (ESTRACE) 0.5 MG tablet Take 1 tablet (0.5 mg  total) by mouth daily. 11/01/22   Copland, Ilona Sorrel, PA-C  fexofenadine (ALLEGRA) 180 MG tablet Take 180 mg by mouth daily.    [provider]  meloxicam (MOBIC) 15 MG tablet Take 15 mg by mouth daily. 07/06/21   [provider]  metoprolol succinate (TOPROL-XL) 25 MG 24 hr tablet Take by mouth. 12/31/19 12/05/22  [provider]  Multiple Vitamin (MULTIVITAMIN WITH MINERALS) TABS tablet Take 1 tablet by mouth daily.    [provider]  norethindrone (AYGESTIN) 5 MG tablet Take 0.5 tablets (2.5 mg total) by mouth daily. 11/01/22   Copland, Alicia B, PA-C  nystatin (MYCOSTATIN/NYSTOP) powder Apply topically daily. 04/03/21   [provider]  Probiotic Product (DIGESTIVE ADVANTAGE GUMMIES) CHEW Chew by mouth.    [provider]  simvastatin (ZOCOR) 20 MG tablet Take 20 mg by mouth at bedtime.  01/23/17   [provider]  vitamin B-12 (CYANOCOBALAMIN) 1000 MCG tablet Take 1,000 mcg by mouth daily.    [provider]      Allergies    Patient has no known allergies.    Review of Systems   Review of Systems  Constitutional:  Negative for fever.  Respiratory:  Negative for shortness of breath.   Cardiovascular:  Negative for chest pain and leg swelling.  Gastrointestinal:  Negative for abdominal distention, abdominal pain and constipation.  Genitourinary:  Negative for difficulty urinating, dysuria, flank pain, frequency and urgency.  Musculoskeletal:  Positive for back pain. Negative  for gait problem and joint swelling.  Skin:  Negative for rash.  Neurological:  Negative for weakness and numbness.  All other systems reviewed and are negative.   Physical Exam Updated Vital Signs BP (!) 167/84 (BP Location: Right Arm)   Pulse 71   Temp 97.6 F (36.4 C) (Oral)   Resp 20   Ht 5\' 3"  (1.6 m)   Wt 80.7 kg   SpO2 95%   BMI 31.53 kg/m  Physical Exam Vitals and nursing note reviewed.  Constitutional:      Appearance: She is  well-developed.  HENT:     Head: Normocephalic.  Eyes:     Conjunctiva/sclera: Conjunctivae normal.  Cardiovascular:     Rate and Rhythm: Normal rate.     Pulses: Normal pulses.     Comments: Pedal pulses normal. Pulmonary:     Effort: Pulmonary effort is normal.  Abdominal:     General: Bowel sounds are normal. There is no distension.     Palpations: Abdomen is soft. There is no mass.  Musculoskeletal:        General: Normal range of motion.     Cervical back: Normal range of motion and neck supple.     Lumbar back: Tenderness present. No swelling, edema or spasms.  Skin:    General: Skin is warm and dry.  Neurological:     Mental Status: She is alert.     Sensory: No sensory deficit.     Motor: No tremor or atrophy.     Gait: Gait normal.     Comments: No strength deficit noted in hip and knee flexor and extensor muscle groups.  Ankle flexion and extension intact.     ED Results / Procedures / Treatments   Labs (all labs ordered are listed, but only abnormal results are displayed) Labs Reviewed - No data to display  EKG None  Radiology No results found.  Procedures Procedures    Medications Ordered in ED Medications  dexamethasone (DECADRON) injection 10 mg (10 mg Intravenous Given 08/09/23 1439)  HYDROmorphone (DILAUDID) injection 0.5 mg (0.5 mg Intravenous Given 08/09/23 1440)  ondansetron (ZOFRAN) injection 4 mg (4 mg Intravenous Given 08/09/23 1438)    ED Course/ Medical Decision Making/ A&P                                 Medical Decision Making No neuro deficit on exam or by history to suggest emergent or surgical presentation.  Patient is encouraged to continue with her Flexeril and to start the Medrol Dosepak she was prescribed tomorrow as she has received an IV dose of Decadron today.  She was also given an IV dose of Dilaudid here after which she was ambulatory without difficulty.  There is no neuro deficits on today's exam.,  No evidence of cauda equina.   Outlined worsened sx that should prompt immediate re-evaluation including distal weakness, bowel/bladder retention/incontinence.  She does states she has follow-up care with her PCP in 3 days.        Risk Prescription drug management.           Final Clinical Impression(s) / ED Diagnoses Final diagnoses:  Sciatica of right side    Rx / DC Orders ED Discharge Orders          Ordered    HYDROcodone-acetaminophen (NORCO/VICODIN) 5-325 MG tablet  Every 6 hours PRN        08/09/23 1529  Burgess Amor, PA-C 08/09/23 1603    Loetta Rough, MD 08/11/23 [phone]

## 2023-08-09 NOTE — Discharge Instructions (Signed)
Take your next dose of the steroid already prescribed tomorrow morning.  Use the the other medicines as directed.  You may continue also taking your tramadol,  but if not relieving your pain,  I have prescribed you a stronger pain medicine if needed. Do not drive within 4 hours of taking hydrocodone as this will make you drowsy.  Also do not take this in addition to tramadol, take 1 medicine or the other if needed for increased pain.  I do recommend you continuing the muscle relaxer you are also prescribed.  Avoid lifting,  Bending,  Twisting or any other activity that worsens your pain over the next week.  Apply an  icepack  to your lower back for 10-15 minutes every 2 hours for the next 2 days.  You should get rechecked if your symptoms are not better over the next 5 days,  Or you develop increased pain,  Weakness in your leg(s) or loss of bladder or bowel function - these are symptoms of a worse injury.

## 2023-08-09 NOTE — Telephone Encounter (Cosign Needed)
Pt called to advise that [facility] does not have hydrocodone to fill her order,  confirmed by charge RN Meridee Score.  New script sent to [facility]

## 2023-08-12 DIAGNOSIS — M5432 Sciatica, left side: Secondary | ICD-10-CM | POA: Diagnosis not present

## 2023-08-14 ENCOUNTER — Other Ambulatory Visit: Payer: Self-pay | Admitting: Family Medicine

## 2023-08-14 DIAGNOSIS — Z1231 Encounter for screening mammogram for malignant neoplasm of breast: Secondary | ICD-10-CM

## 2023-08-26 DIAGNOSIS — M5440 Lumbago with sciatica, unspecified side: Secondary | ICD-10-CM | POA: Diagnosis not present

## 2023-08-29 DIAGNOSIS — R531 Weakness: Secondary | ICD-10-CM | POA: Diagnosis not present

## 2023-08-29 DIAGNOSIS — M5440 Lumbago with sciatica, unspecified side: Secondary | ICD-10-CM | POA: Diagnosis not present

## 2023-08-30 DIAGNOSIS — D2261 Melanocytic nevi of right upper limb, including shoulder: Secondary | ICD-10-CM | POA: Diagnosis not present

## 2023-08-30 DIAGNOSIS — D485 Neoplasm of uncertain behavior of skin: Secondary | ICD-10-CM | POA: Diagnosis not present

## 2023-08-30 DIAGNOSIS — Z85828 Personal history of other malignant neoplasm of skin: Secondary | ICD-10-CM | POA: Diagnosis not present

## 2023-08-30 DIAGNOSIS — D2272 Melanocytic nevi of left lower limb, including hip: Secondary | ICD-10-CM | POA: Diagnosis not present

## 2023-08-30 DIAGNOSIS — D225 Melanocytic nevi of trunk: Secondary | ICD-10-CM | POA: Diagnosis not present

## 2023-08-30 DIAGNOSIS — D2271 Melanocytic nevi of right lower limb, including hip: Secondary | ICD-10-CM | POA: Diagnosis not present

## 2023-08-30 DIAGNOSIS — D2262 Melanocytic nevi of left upper limb, including shoulder: Secondary | ICD-10-CM | POA: Diagnosis not present

## 2023-09-02 DIAGNOSIS — M25461 Effusion, right knee: Secondary | ICD-10-CM | POA: Diagnosis not present

## 2023-09-05 DIAGNOSIS — M00861 Arthritis due to other bacteria, right knee: Secondary | ICD-10-CM | POA: Diagnosis not present

## 2023-09-05 DIAGNOSIS — I10 Essential (primary) hypertension: Secondary | ICD-10-CM | POA: Diagnosis not present

## 2023-09-05 DIAGNOSIS — M545 Low back pain, unspecified: Secondary | ICD-10-CM | POA: Diagnosis not present

## 2023-09-05 DIAGNOSIS — M01X0 Direct infection of unspecified joint in infectious and parasitic diseases classified elsewhere: Secondary | ICD-10-CM | POA: Diagnosis not present

## 2023-09-05 DIAGNOSIS — Z0189 Encounter for other specified special examinations: Secondary | ICD-10-CM | POA: Diagnosis not present

## 2023-09-05 DIAGNOSIS — M009 Pyogenic arthritis, unspecified: Secondary | ICD-10-CM | POA: Diagnosis not present

## 2023-09-05 DIAGNOSIS — E785 Hyperlipidemia, unspecified: Secondary | ICD-10-CM | POA: Diagnosis not present

## 2023-09-05 DIAGNOSIS — Z79890 Hormone replacement therapy (postmenopausal): Secondary | ICD-10-CM | POA: Diagnosis not present

## 2023-09-05 HISTORY — PX: KNEE SURGERY: SHX244

## 2023-09-17 DIAGNOSIS — M25461 Effusion, right knee: Secondary | ICD-10-CM | POA: Diagnosis not present

## 2023-09-17 DIAGNOSIS — M25561 Pain in right knee: Secondary | ICD-10-CM | POA: Diagnosis not present

## 2023-09-18 ENCOUNTER — Ambulatory Visit
Admission: RE | Admit: 2023-09-18 | Discharge: 2023-09-18 | Disposition: A | Discharge status: Home / Self Care | Payer: 59 | Source: Ambulatory Visit | Attending: Family Medicine | Admitting: Family Medicine

## 2023-09-18 DIAGNOSIS — Z1231 Encounter for screening mammogram for malignant neoplasm of breast: Secondary | ICD-10-CM | POA: Diagnosis not present

## 2023-09-19 DIAGNOSIS — M009 Pyogenic arthritis, unspecified: Secondary | ICD-10-CM | POA: Diagnosis not present

## 2023-09-20 ENCOUNTER — Other Ambulatory Visit: Payer: Self-pay

## 2023-09-20 ENCOUNTER — Ambulatory Visit
Admission: RE | Admit: 2023-09-20 | Discharge: 2023-09-20 | Disposition: A | Discharge status: Home / Self Care | Payer: 59 | Source: Ambulatory Visit | Attending: Cardiology | Admitting: Cardiology

## 2023-09-20 ENCOUNTER — Emergency Department
Admission: EM | Admit: 2023-09-20 | Discharge: 2023-09-20 | Disposition: A | Discharge status: Home / Self Care | Payer: 59 | Attending: Emergency Medicine | Admitting: Emergency Medicine

## 2023-09-20 ENCOUNTER — Encounter: Payer: Self-pay | Admitting: Intensive Care

## 2023-09-20 ENCOUNTER — Other Ambulatory Visit: Payer: Self-pay | Admitting: Cardiology

## 2023-09-20 DIAGNOSIS — R2241 Localized swelling, mass and lump, right lower limb: Secondary | ICD-10-CM

## 2023-09-20 DIAGNOSIS — D72829 Elevated white blood cell count, unspecified: Secondary | ICD-10-CM | POA: Diagnosis not present

## 2023-09-20 DIAGNOSIS — I82431 Acute embolism and thrombosis of right popliteal vein: Secondary | ICD-10-CM | POA: Diagnosis not present

## 2023-09-20 DIAGNOSIS — I10 Essential (primary) hypertension: Secondary | ICD-10-CM | POA: Insufficient documentation

## 2023-09-20 DIAGNOSIS — M7989 Other specified soft tissue disorders: Secondary | ICD-10-CM | POA: Diagnosis not present

## 2023-09-20 DIAGNOSIS — M009 Pyogenic arthritis, unspecified: Secondary | ICD-10-CM | POA: Diagnosis not present

## 2023-09-20 DIAGNOSIS — I82401 Acute embolism and thrombosis of unspecified deep veins of right lower extremity: Secondary | ICD-10-CM | POA: Diagnosis not present

## 2023-09-20 LAB — PROTIME-INR
INR: 1.1 (ref 0.8–1.2)
Prothrombin Time: 14.8 s (ref 11.4–15.2)

## 2023-09-20 LAB — CBC WITH DIFFERENTIAL/PLATELET
Abs Immature Granulocytes: 0.08 10*3/uL — ABNORMAL HIGH (ref 0.00–0.07)
Basophils Absolute: 0.1 10*3/uL (ref 0.0–0.1)
Basophils Relative: 0 %
Eosinophils Absolute: 0 10*3/uL (ref 0.0–0.5)
Eosinophils Relative: 0 %
HCT: 38.5 % (ref 36.0–46.0)
Hemoglobin: 12.8 g/dL (ref 12.0–15.0)
Immature Granulocytes: 0 %
Lymphocytes Relative: 14 %
Lymphs Abs: 2.6 10*3/uL (ref 0.7–4.0)
MCH: 30.7 pg (ref 26.0–34.0)
MCHC: 33.2 g/dL (ref 30.0–36.0)
MCV: 92.3 fL (ref 80.0–100.0)
Monocytes Absolute: 1.3 10*3/uL — ABNORMAL HIGH (ref 0.1–1.0)
Monocytes Relative: 7 %
Neutro Abs: 14 10*3/uL — ABNORMAL HIGH (ref 1.7–7.7)
Neutrophils Relative %: 79 %
Platelets: 537 10*3/uL — ABNORMAL HIGH (ref 150–400)
RBC: 4.17 MIL/uL (ref 3.87–5.11)
RDW: 13.2 % (ref 11.5–15.5)
WBC: 18.1 10*3/uL — ABNORMAL HIGH (ref 4.0–10.5)
nRBC: 0 % (ref 0.0–0.2)

## 2023-09-20 LAB — BASIC METABOLIC PANEL
Anion gap: 12 (ref 5–15)
BUN: 14 mg/dL (ref 8–23)
CO2: 24 mmol/L (ref 22–32)
Calcium: 9.6 mg/dL (ref 8.9–10.3)
Chloride: 100 mmol/L (ref 98–111)
Creatinine, Ser: 0.79 mg/dL (ref 0.44–1.00)
GFR, Estimated: >60 mL/min (ref >60)
Glucose, Bld: 99 mg/dL (ref 70–99)
Potassium: 3.8 mmol/L (ref 3.5–5.1)
Sodium: 136 mmol/L (ref 135–145)

## 2023-09-20 MED ORDER — APIXABAN (ELIQUIS) VTE STARTER PACK (10MG AND 5MG): ORAL_TABLET | ORAL | 0 refills | Status: DC

## 2023-09-20 NOTE — ED Triage Notes (Signed)
Patient presents with blood clot in right leg behind knee. Pain and swelling X2 days. Had [place] this AM that showed blood clot.   Had right knee surgery October 3rd

## 2023-09-20 NOTE — ED Provider Notes (Signed)
[facility] Provider Note    Event Date/Time   First MD Initiated Contact with Patient 09/20/23 1334     (approximate)   History   Chief Complaint Leg Swelling   HPI  Patricia Callahan is a 62 y.o. female with past medical history of hypertension, lymphedema, and Crohn's disease who presents to the ED complaining of leg swelling.  Patient reports that she recently had surgery on her right knee with Dr. Odis Luster to washout infection, seem to be doing better initially but then had increased swelling and pain behind her right knee over the past couple of days.  She went back to see Dr. Odis Luster today, had a small amount of fluid drained off of the knee but most of the swelling was behind the knee, and so she was sent for ultrasound.  Ultrasound found DVT in the right leg and so patient was sent to the ED for further evaluation.  She denies any history of DVT/PE, has not had any chest pain or shortness of breath.  She denies any fevers and has been able to bend the knee with mild pain.     Physical Exam   Triage Vital Signs: ED Triage Vitals  Encounter Vitals Group     BP 09/20/23 1212 (!) 156/91     Systolic BP Percentile --      Diastolic BP Percentile --      Pulse Rate 09/20/23 1212 96     Resp 09/20/23 1212 18     Temp 09/20/23 1212 98.4 F (36.9 C)     Temp Source 09/20/23 1212 Oral     SpO2 09/20/23 1212 96 %     Weight 09/20/23 1213 178 lb (80.7 kg)     Height 09/20/23 1213 5\' 4"  (1.626 m)     Head Circumference --      Peak Flow --      Pain Score 09/20/23 1212 3     Pain Loc --      Pain Education --      Exclude from Growth Chart --     Most recent vital signs: Vitals:   09/20/23 1212  BP: (!) 156/91  Pulse: 96  Resp: 18  Temp: 98.4 F (36.9 C)  SpO2: 96%    Constitutional: Alert and oriented. Eyes: Conjunctivae are normal. Head: Atraumatic. Nose: No congestion/rhinnorhea. Mouth/Throat: Mucous membranes are moist.  Cardiovascular:  Normal rate, regular rhythm. Grossly normal heart sounds.  2+ radial pulses bilaterally. Respiratory: Normal respiratory effort.  No retractions. Lungs CTAB. Gastrointestinal: Soft and nontender. No distention. Musculoskeletal: 1+ pitting edema to right calf as well as posterior right knee with mild tenderness, no erythema or warmth noted.  Patient able to range right knee with mild pain. Neurologic:  Normal speech and language. No gross focal neurologic deficits are appreciated.    ED Results / Procedures / Treatments   Labs (all labs ordered are listed, but only abnormal results are displayed) Labs Reviewed  CBC WITH DIFFERENTIAL/PLATELET - Abnormal; Notable for the following components:      Result Value   WBC 18.1 (*)    Platelets 537 (*)    Neutro Abs 14.0 (*)    Monocytes Absolute 1.3 (*)    Abs Immature Granulocytes 0.08 (*)    All other components within normal limits  BASIC METABOLIC PANEL  PROTIME-INR    PROCEDURES:  Critical Care performed: No  Procedures   MEDICATIONS ORDERED IN ED: Medications - No data to  display   IMPRESSION / MDM / ASSESSMENT AND PLAN / ED COURSE  I reviewed the triage vital signs and the nursing notes.                              62 y.o. female with past medical history of hypertension, lymphedema, and Crohn's disease who presents to the ED complaining of increasing right leg and knee swelling following recent arthroscopy.  Patient's presentation is most consistent with acute presentation with potential threat to life or bodily function.  Differential diagnosis includes, but is not limited to, DVT, PE, anemia, electrolyte abnormality, AKI, septic arthritis.  Patient nontoxic-appearing and in no acute distress, vital signs are unremarkable.  I reviewed her outpatient ultrasound that was positive for popliteal and calf vein DVT.  She denies any symptoms of PE and with reassuring vital signs, do not feel CTA chest is needed at this time.   Labs are reassuring with no significant anemia, electrolyte abnormality, or AKI.  INR is also unremarkable.  Patient does report issues with recurrent infection in her right knee, had recent washout for this and is currently taking antibiotics with outpatient infectious disease follow-up next week.  Labs show leukocytosis but no signs of worsening infectious process at this time and I do feel patient is appropriate for ongoing outpatient management of her knee issues.  We will start her on Eliquis and referred to vascular surgery for follow-up, patient also counseled to return to the ED for new or worsening symptoms.  Patient agrees with plan.      FINAL CLINICAL IMPRESSION(S) / ED DIAGNOSES   Final diagnoses:  Acute deep vein thrombosis (DVT) of popliteal vein of right lower extremity (HCC)     Rx / DC Orders   ED Discharge Orders          Ordered    APIXABAN (ELIQUIS) VTE STARTER PACK (10MG  AND 5MG )       Note to Pharmacy: If starter pack unavailable, substitute with seventy-four 5 mg apixaban tabs following the above SIG directions.   09/20/23 1407             Note:  This document was prepared using Dragon voice recognition software and may include unintentional dictation errors.   Chesley Noon, MD 09/20/23 1416

## 2023-09-20 NOTE — ED Notes (Signed)
Dr. Larinda Buttery at bedside. Family at bedside.

## 2023-09-21 ENCOUNTER — Other Ambulatory Visit: Payer: Self-pay

## 2023-09-21 ENCOUNTER — Emergency Department
Admission: EM | Admit: 2023-09-21 | Discharge: 2023-09-21 | Disposition: A | Discharge status: Home / Self Care | Payer: 59 | Attending: Emergency Medicine | Admitting: Emergency Medicine

## 2023-09-21 DIAGNOSIS — M7989 Other specified soft tissue disorders: Secondary | ICD-10-CM

## 2023-09-21 DIAGNOSIS — R600 Localized edema: Secondary | ICD-10-CM | POA: Insufficient documentation

## 2023-09-21 DIAGNOSIS — Z7901 Long term (current) use of anticoagulants: Secondary | ICD-10-CM | POA: Diagnosis not present

## 2023-09-21 DIAGNOSIS — R2241 Localized swelling, mass and lump, right lower limb: Secondary | ICD-10-CM | POA: Diagnosis not present

## 2023-09-21 DIAGNOSIS — R2240 Localized swelling, mass and lump, unspecified lower limb: Secondary | ICD-10-CM | POA: Diagnosis not present

## 2023-09-21 NOTE — ED Provider Notes (Signed)
   [facility] Provider Note    Event Date/Time   First MD Initiated Contact with Patient 09/21/23 1939     (approximate)   History   Leg Swelling   HPI  Patricia Callahan is a 62 y.o. female who comes ED complaining of pain and swelling in her right lower extremity.  She was seen in the ED yesterday for similar symptoms, had a lower extremity ultrasound which confirmed a popliteal DVT, started on Eliquis which she has started taking, has taken a total of 2 doses so far.  No chest pain shortness of breath or fever.     Physical Exam   Triage Vital Signs: ED Triage Vitals  Encounter Vitals Group     BP 09/21/23 1745 (!) 152/71     Systolic BP Percentile --      Diastolic BP Percentile --      Pulse Rate 09/21/23 1745 (!) 105     Resp 09/21/23 1745 18     Temp 09/21/23 1745 98.3 F (36.8 C)     Temp src --      SpO2 09/21/23 1745 96 %     Weight 09/21/23 1744 178 lb (80.7 kg)     Height 09/21/23 1744 5\' 4"  (1.626 m)     Head Circumference --      Peak Flow --      Pain Score 09/21/23 1743 5     Pain Loc --      Pain Education --      Exclude from Growth Chart --     Most recent vital signs: Vitals:   09/21/23 1745  BP: (!) 152/71  Pulse: (!) 105  Resp: 18  Temp: 98.3 F (36.8 C)  SpO2: 96%    General: Awake, no distress.  CV:  Good peripheral perfusion.  Regular rate rhythm Resp:  Normal effort.  Clear auscultation bilaterally Abd:  No distention.  Other:  Trace pitting edema in the right lower extremity.  No erythema warmth induration or rash.   ED Results / Procedures / Treatments   Labs (all labs ordered are listed, but only abnormal results are displayed) Labs Reviewed - No data to display   RADIOLOGY    PROCEDURES:  Procedures   MEDICATIONS ORDERED IN ED: Medications - No data to display   IMPRESSION / MDM / ASSESSMENT AND PLAN / ED COURSE  I reviewed the triage vital signs and the nursing notes.                              Presents with continued pain and swelling of the right lower leg with recent diagnosis of a DVT, started on Eliquis which she has been taking.  Reassured patient, no evidence of infection or PE.  Continue current follow-up plan.  Has an appointment in 3 days.      FINAL CLINICAL IMPRESSION(S) / ED DIAGNOSES   Final diagnoses:  Leg swelling     Rx / DC Orders   ED Discharge Orders     None        Note:  This document was prepared using Dragon voice recognition software and may include unintentional dictation errors.   Sharman Cheek, MD 09/22/23 [phone]

## 2023-09-21 NOTE — ED Triage Notes (Signed)
Pt to ED for right leg swelling. Reports was seen yesterday and dx with DVT, started on blood thinners.

## 2023-09-24 ENCOUNTER — Ambulatory Visit: Payer: 59 | Attending: Infectious Diseases | Admitting: Infectious Diseases

## 2023-09-24 VITALS — BP 168/88 | HR 93 | Temp 96.8°F

## 2023-09-24 DIAGNOSIS — M00061 Staphylococcal arthritis, right knee: Secondary | ICD-10-CM | POA: Diagnosis not present

## 2023-09-24 DIAGNOSIS — B9561 Methicillin susceptible Staphylococcus aureus infection as the cause of diseases classified elsewhere: Secondary | ICD-10-CM | POA: Insufficient documentation

## 2023-09-24 DIAGNOSIS — I10 Essential (primary) hypertension: Secondary | ICD-10-CM | POA: Diagnosis not present

## 2023-09-24 DIAGNOSIS — E785 Hyperlipidemia, unspecified: Secondary | ICD-10-CM | POA: Insufficient documentation

## 2023-09-24 DIAGNOSIS — I82431 Acute embolism and thrombosis of right popliteal vein: Secondary | ICD-10-CM

## 2023-09-24 DIAGNOSIS — M1120 Other chondrocalcinosis, unspecified site: Secondary | ICD-10-CM | POA: Diagnosis not present

## 2023-09-24 DIAGNOSIS — Z86718 Personal history of other venous thrombosis and embolism: Secondary | ICD-10-CM | POA: Insufficient documentation

## 2023-09-24 DIAGNOSIS — Z7901 Long term (current) use of anticoagulants: Secondary | ICD-10-CM | POA: Insufficient documentation

## 2023-09-24 MED ORDER — CEPHALEXIN 500 MG PO TABS: 1000.0000 mg | ORAL_TABLET | Freq: Four times a day (QID) | ORAL | 0 refills | Status: DC

## 2023-09-24 NOTE — Patient Instructions (Signed)
VISIT SUMMARY:  During your visit, we discussed several health issues including a knee infection, a blood clot in your leg, pseudogout, and a history of inflammation in your intestines. We also reviewed your general health and medications.  YOUR PLAN:  -KNEE INFECTION: You have an infection in your knee caused by a bacteria called Staphylococcus aureus. We are increasing your Cefalexin dosage to 1g every 6 hours. If your knee continues to swell and hurt, we may need to do an MRI. We will also repeat blood tests in 2 weeks to check on your body's response to the infection.  -DEEP VEIN THROMBOSIS (DVT): You have a blood clot in the vein of your leg, which is causing pain and swelling. Continue taking your Eliquis as prescribed and see the vascular specialist on November 4th.  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 We did not discuss a specific treatment plan for this issue during this visit.  -CHRONIC ACTIVE COLITIS/ENTERITIS: You have a history of inflammation in your intestines, which required surgery. It's unclear if this is related to Crohn's disease. Continue following the plan set by your gastroenterologist.  -GENERAL HEALTH MAINTENANCE: Continue taking your current medications including Estradiol, Metoprolol, Simvastatin, and Noradrine. Consider taking probiotics and stool softeners to manage potential side effects of antibiotics on your digestive system.  INSTRUCTIONS:  Remember to take your increased dose of Cefalexin and continue with your other medications as prescribed. Make sure to attend your follow-up appointment with the vascular specialist on November 4th.

## 2023-09-24 NOTE — Progress Notes (Signed)
NAME: Patricia Callahan  DOB: [birth year]  MRN: [id number]  Date/Time: 09/24/2023 9:55 AM   Subjective:   ?She is here with her friend Discussed the use of AI scribe software for clinical note transcription with the patient, who gave verbal consent to proceed.  History of Present Illness          Patricia Callahan is a 62 y.o. 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 As per patient a few days prior to that she had procedure done on the chest lesion by derm  On 10/3 pt underwent arthroscopic debridement by DrBowers. Cell count was 1776- positive for cppd crystals Culture was negative She continued Doxy As the knee continued to be swollen along with worsening leg swelling she had another aspiration on 10/18 and culture was negative. She had ultrasound that day and it showed popliteal and calf vein DVT. She was referred to ED and was starte don eliquis and sent home from ED on 10/19. She was started on Keflex on 10/16 by Dr.Bowers Pt says her knee pain is much better and is able to bend to knee upto 90deg Her ankle and leg swollen  Past Medical History:  Diagnosis Date   Crohn's disease (HCC)    External hemorrhoid    GERD (gastroesophageal reflux disease)    OCC TUMS PRN   Hypercholesteremia    Hypertension    Plantar fasciitis of left foot    Vasomotor symptoms due to menopause     Past Surgical History:  Procedure Laterality Date   COLON SURGERY     COLONOSCOPY  2015   normal, was advised to return in 10 years   COLONOSCOPY N/A 12/05/2022   Procedure: COLONOSCOPY;  Surgeon: Toledo, Boykin Nearing, MD;  Location: [facility] ENDOSCOPY;   Service: Gastroenterology;  Laterality: N/A;   COLONOSCOPY WITH PROPOFOL N/A 09/10/2019   Procedure: COLONOSCOPY WITH PROPOFOL;  Surgeon: Wyline Mood, MD;  Location: [facility]COPY;  Service: Gastroenterology;  Laterality: N/A;   COLOSTOMY REVISION  10/08/2019   Procedure: COLON RESECTION RIGHT;  Surgeon: Sung Amabile, DO;  Location: [facility] ORS;  Service: General;;   KNEE SURGERY Right    LAPAROSCOPIC RIGHT COLECTOMY Right 10/08/2019   Procedure: ATTEMPTED LAPAROSCOPIC RIGHT COLECTOMY;  Surgeon: Sung Amabile, DO;  Location: [facility] ORS;  Service: General;  Laterality: Right;   PLANTAR FASCIA RELEASE Left 04/28/2020   Procedure: ENDOSCOPIC PLANTAR FASCIA RELEASE WITH TOPAZ LEFT;  Surgeon: Recardo Evangelist, DPM;  Location: [facility];  Service: Podiatry;  Laterality: Left;  LMA WITH POP BLOCK    Social History   Socioeconomic History   Marital status: Widowed    Spouse name: Not on file   Number of children: 0   Years of education: 14   Highest education level: Not on file  Occupational History   Not on file  Tobacco Use   Smoking status: Never   Smokeless tobacco: Never  Vaping Use   Vaping status: Never Used  Substance and Sexual Activity   Alcohol use: Not Currently    Comment: SOCIAL   Drug use: No  Sexual activity: Yes    Birth control/protection: None, Post-menopausal  Other Topics Concern   Not on file  Social History Narrative   Not on file   Social Determinants of Health   Financial Resource Strain: Low Risk  (01/10/2023)   Received from [facility], [facility]   Overall Financial Resource Strain (CARDIA)    Difficulty of Paying Living Expenses: Not very hard  Food Insecurity: No Food Insecurity (01/10/2023)   Received from [facility], [facility]   Hunger Vital Sign    Worried About Running Out of Food in the Last Year: Never true    Ran Out of Food in the Last Year: Never true   Transportation Needs: No Transportation Needs (01/10/2023)   Received from [facility], [facility]   [facility] - Transportation    In the past 12 months, has lack of transportation kept you from medical appointments or from getting medications?: No    Lack of Transportation (Non-Medical): No  Physical Activity: Inactive (03/28/2018)   Exercise Vital Sign    Days of Exercise per Week: 0 days    Minutes of Exercise per Session: 0 min  Stress: No Stress Concern Present (03/28/2018)   [employer] - Occupational Stress Questionnaire    Feeling of Stress : Not at all  Social Connections: Not on file  Intimate Partner Violence: Not on file    Family History  Problem Relation Age of Onset   Diabetes Mother    Hypertension Mother    Hyperlipidemia Mother    Hypertension Father    Hyperlipidemia Father    Dementia Father    Prostate cancer Father    Heart disease Father    Breast cancer Neg Hx    No Known Allergies I? Current Outpatient Medications  Medication Sig Dispense Refill   APIXABAN (ELIQUIS) VTE STARTER PACK (10MG  AND 5MG ) Take as directed on package: start with two-5mg  tablets twice daily for 7 days. On day 8, switch to one-5mg  tablet twice daily. 74 each 0   Ascorbic Acid (VITAMIN C) 1000 MG tablet Take 1,000 mg by mouth daily.     calcium carbonate (TUMS - DOSED IN MG ELEMENTAL CALCIUM) 500 MG chewable tablet Chew 1 tablet by mouth as needed for indigestion or heartburn.     Cephalexin 500 MG tablet TAKE 1 TABLET BY MOUTH EVERY 6 HOURS FOR 14 DAYS     Cholecalciferol (VITAMIN D3) 50 MCG (2000 UT) TABS Take 2,000 Units by mouth daily.     estradiol (ESTRACE) 0.5 MG tablet Take 1 tablet (0.5 mg total) by mouth daily. 90 tablet 3   fexofenadine (ALLEGRA) 180 MG tablet Take 180 mg by mouth daily.     HYDROcodone-acetaminophen (NORCO/VICODIN) 5-325 MG tablet Take 1 tablet by mouth every 6 (six) hours as needed. 15 tablet  0   Multiple Vitamin (MULTIVITAMIN WITH MINERALS) TABS tablet Take 1 tablet by mouth daily.     norethindrone (AYGESTIN) 5 MG tablet Take 0.5 tablets (2.5 mg total) by mouth daily. 45 tablet 3   nystatin (MYCOSTATIN/NYSTOP) powder Apply topically daily.     Probiotic Product (DIGESTIVE ADVANTAGE GUMMIES) CHEW Chew by mouth.     simvastatin (ZOCOR) 20 MG tablet Take 20 mg by mouth at bedtime.      vitamin B-12 (CYANOCOBALAMIN) 1000 MCG tablet Take 1,000 mcg by mouth daily.     metoprolol succinate (TOPROL-XL) 25 MG 24 hr  tablet Take by mouth.     No current facility-administered medications for this visit.     Abtx:  Anti-infectives (From admission, onward)    None       REVIEW OF SYSTEMS:  Const: negative fever, negative chills, negative weight loss Eyes: negative diplopia or visual changes, negative eye pain ENT: negative coryza, negative sore throat Resp: negative cough, hemoptysis, dyspnea Cards: negative for chest pain, palpitations, lower extremity edema GU: negative for frequency, dysuria and hematuria GI: Negative for abdominal pain, diarrhea, bleeding, constipation Skin: negative for rash and pruritus Heme: negative for easy bruising and gum/nose bleeding MS: rt leg pain and swelling Neurolo:negative for headaches, dizziness, vertigo, memory problems  Psych: negative for feelings of anxiety, depression  Endocrine: negative for thyroid, diabetes Allergy/Immunology- negative for any medication or food allergies  Objective:  VITALS:  BP (!) 168/88   Pulse 93   Temp (!) 96.8 F (36 C) (Temporal)   PHYSICAL EXAM:  General: Alert, cooperative, no distress, appears stated age. In wheel chair Head: Normocephalic, without obvious abnormality, atraumatic. Eyes: Conjunctivae clear, anicteric sclerae. Pupils are equal ENT Nares normal. No drainage or sinus tenderness. Lips, mucosa, and tongue normal. No Thrush Neck: Supple, symmetrical, no adenopathy, thyroid: non  tender no carotid bruit and no JVD. Back: No CVA tenderness. Lungs: Clear to auscultation bilaterally. No Wheezing or Rhonchi. No rales. Heart: Regular rate and rhythm, no murmur, rub or gallop. Abdomen: did not examine Extremities: rt leg swollen  Skin: No rashes or lesions. Or bruising Lymph: Cervical, supraclavicular normal. Neurologic: Grossly non-focal Pertinent Labs Lab Results CBC    Component Value Date/Time   WBC 18.1 (H) 09/20/2023 1215   RBC 4.17 09/20/2023 1215   HGB 12.8 09/20/2023 1215   HGB 14.1 11/30/2019 1419   HCT 38.5 09/20/2023 1215   HCT 41.8 11/30/2019 1419   PLT 537 (H) 09/20/2023 1215   PLT 506 (H) 11/30/2019 1419   MCV 92.3 09/20/2023 1215   MCV 92 11/30/2019 1419   MCH 30.7 09/20/2023 1215   MCHC 33.2 09/20/2023 1215   RDW 13.2 09/20/2023 1215   RDW 12.3 11/30/2019 1419   LYMPHSABS 2.6 09/20/2023 1215   MONOABS 1.3 (H) 09/20/2023 1215   EOSABS 0.0 09/20/2023 1215   BASOSABS 0.1 09/20/2023 1215       Latest Ref Rng & Units 09/20/2023   12:15 PM 11/30/2019    2:19 PM 10/22/2019    4:10 PM  CMP  Glucose 70 - 99 mg/dL 99   78   BUN 8 - 23 mg/dL 14   15   Creatinine 1.61 - 1.00 mg/dL 0.96   0.45   Sodium 409 - 145 mmol/L 136   142   Potassium 3.5 - 5.1 mmol/L 3.8   4.2   Chloride 98 - 111 mmol/L 100   102   CO2 22 - 32 mmol/L 24   22   Calcium 8.9 - 10.3 mg/dL 9.6   81.1   Total Protein 6.0 - 8.5 g/dL  7.2  7.6   Total Bilirubin 0.0 - 1.2 mg/dL  <9.1  0.2   Alkaline Phos 39 - 117 IU/L  53  65   AST 0 - 40 IU/L  21  19   ALT 0 - 32 IU/L  13  14    ESR 1/18 59 CRP 48   Microbiology: 9/30 synovial fluid- MSSA 10/3 culture neg 10/18 culture neg  IMAGING RESULTS: I have personally reviewed the films ? 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 ? _I have personally spent  -40--minutes involved in face-to-face and non-face-to-face activities for this patient on the day of the visit. Professional time spent includes the following activities: Preparing to see the patient (review of tests), Obtaining and/or reviewing separately obtained history (admission/discharge record), Performing a medically appropriate examination and/or evaluation , Ordering medications/tests/procedures, referring and communicating with other health care professionals, Documenting clinical information in the EMR, Independently interpreting results (not separately reported), Communicating results to the patient/family/caregiver, Counseling and educating the patient/family/caregiver and Care coordination (not separately reported).    ________________________________________________  Note:  This document was prepared using Conservation officer, historic buildings and may include unintentional dictation errors.

## 2023-09-26 ENCOUNTER — Ambulatory Visit: Payer: 59 | Admitting: Infectious Diseases

## 2023-10-06 ENCOUNTER — Other Ambulatory Visit: Payer: Self-pay | Admitting: Obstetrics and Gynecology

## 2023-10-06 DIAGNOSIS — Z79890 Hormone replacement therapy (postmenopausal): Secondary | ICD-10-CM

## 2023-10-06 DIAGNOSIS — N951 Menopausal and female climacteric states: Secondary | ICD-10-CM

## 2023-10-07 ENCOUNTER — Encounter (INDEPENDENT_AMBULATORY_CARE_PROVIDER_SITE_OTHER): Payer: Self-pay | Admitting: Vascular Surgery

## 2023-10-07 ENCOUNTER — Ambulatory Visit (INDEPENDENT_AMBULATORY_CARE_PROVIDER_SITE_OTHER): Payer: 59 | Admitting: Vascular Surgery

## 2023-10-07 VITALS — BP 159/91 | HR 90 | Resp 16 | Wt 171.0 lb

## 2023-10-07 DIAGNOSIS — I82431 Acute embolism and thrombosis of right popliteal vein: Secondary | ICD-10-CM

## 2023-10-07 DIAGNOSIS — M4726 Other spondylosis with radiculopathy, lumbar region: Secondary | ICD-10-CM

## 2023-10-07 DIAGNOSIS — I872 Venous insufficiency (chronic) (peripheral): Secondary | ICD-10-CM | POA: Diagnosis not present

## 2023-10-07 DIAGNOSIS — I10 Essential (primary) hypertension: Secondary | ICD-10-CM | POA: Diagnosis not present

## 2023-10-07 DIAGNOSIS — E7800 Pure hypercholesterolemia, unspecified: Secondary | ICD-10-CM

## 2023-10-07 DIAGNOSIS — I82409 Acute embolism and thrombosis of unspecified deep veins of unspecified lower extremity: Secondary | ICD-10-CM | POA: Insufficient documentation

## 2023-10-07 NOTE — Progress Notes (Signed)
MRN : [id number]  Patricia Callahan is a 62 y.o. (1961-01-15) female who presents with chief complaint of legs hurt and swell.  History of Present Illness:   The patient presents to the office for evaluation of DVT.  DVT was identified at [facility] by Duplex ultrasound dated 09/20/2023.    The initial symptoms were pain and swelling in the lower extremity.  The patient notes the affected leg is painful with dependency and swells.  Symptoms are much better with elevation.  The patient notes minimal edema in the morning which steadily worsens throughout the day.    The patient has not been using compression therapy at this point.  No SOB or pleuritic chest pains.  No cough or hemoptysis.  No blood per rectum or blood in any sputum.  No excessive bruising per the patient.   The patient is also complaining of painful lower extremities. Patient notes the pain is variable and not always associated with activity.  The pain is somewhat consistent day to day occurring on most days. The patient notes the pain also occurs with standing or sitting for long periods.  The extremity pain routinely seems worse as the day wears on. The pain has been progressive over the past several years. The patient states these symptoms are causing a negative impact on quality of life and daily activities which was a factor in the evaluation.  The patient has a history of back problems and DJD of the lumbar and sacral spine.   The patient denies rest pain or dangling of an extremity off the side of the bed during the night for relief. No open wounds or sores at this time.  The patient denies amaurosis fugax or recent TIA symptoms. There are no recent neurological changes noted. No recent episodes of angina or shortness of breath documented.   Duplex ultrasound of the right lower extremity dated 09/20/2023 demonstrates acute DVT in the right popliteal and calf veins.  No outpatient medications have been marked as  taking for the 10/07/23 encounter (Appointment) with Gilda Crease, Latina Craver, MD.    Past Medical History:  Diagnosis Date   Crohn's disease [facility])    External hemorrhoid    GERD (gastroesophageal reflux disease)    OCC TUMS PRN   Hypercholesteremia    Hypertension    Plantar fasciitis of left foot    Vasomotor symptoms due to menopause     Past Surgical History:  Procedure Laterality Date   COLON SURGERY     COLONOSCOPY  2015   normal, was advised to return in 10 years   COLONOSCOPY N/A 12/05/2022   Procedure: COLONOSCOPY;  Surgeon: Toledo, Boykin Nearing, MD;  Location: [facility] ENDOSCOPY;  Service: Gastroenterology;  Laterality: N/A;   COLONOSCOPY WITH PROPOFOL N/A 09/10/2019   Procedure: COLONOSCOPY WITH PROPOFOL;  Surgeon: Wyline Mood, MD;  Location: [facility] ENDOSCOPY;  Service: Gastroenterology;  Laterality: N/A;   COLOSTOMY REVISION  10/08/2019   Procedure: COLON RESECTION RIGHT;  Surgeon: Sung Amabile, DO;  Location: [facility] ORS;  Service: General;;   KNEE SURGERY Right    LAPAROSCOPIC RIGHT COLECTOMY Right 10/08/2019   Procedure: ATTEMPTED LAPAROSCOPIC RIGHT COLECTOMY;  Surgeon: Sung Amabile, DO;  Location: [facility] ORS;  Service: General;  Laterality: Right;   PLANTAR FASCIA RELEASE Left 04/28/2020   Procedure: ENDOSCOPIC PLANTAR FASCIA RELEASE WITH TOPAZ LEFT;  Surgeon: Recardo Evangelist, DPM;  Location: [facility];  Service: Podiatry;  Laterality: Left;  LMA WITH POP BLOCK  Social History Social History   Tobacco Use   Smoking status: Never   Smokeless tobacco: Never  Vaping Use   Vaping status: Never Used  Substance Use Topics   Alcohol use: Not Currently    Comment: SOCIAL   Drug use: No    Family History Family History  Problem Relation Age of Onset   Diabetes Mother    Hypertension Mother    Hyperlipidemia Mother    Hypertension Father    Hyperlipidemia Father    Dementia Father    Prostate cancer Father    Heart disease Father    Breast cancer Neg Hx      No Known Allergies   REVIEW OF SYSTEMS (Negative unless checked)  Constitutional: [] Weight loss  [] Fever  [] Chills Cardiac: [] Chest pain   [] Chest pressure   [] Palpitations   [] Shortness of breath when laying flat   [] Shortness of breath with exertion. Vascular:  [] Pain in legs with walking   [x] Pain in legs at rest  [] History of DVT   [] Phlebitis   [x] Swelling in legs   [] Varicose veins   [] Non-healing ulcers Pulmonary:   [] Uses home oxygen   [] Productive cough   [] Hemoptysis   [] Wheeze  [] COPD   [] Asthma Neurologic:  [] Dizziness   [] Seizures   [] History of stroke   [] History of TIA  [] Aphasia   [] Vissual changes   [] Weakness or numbness in arm   [] Weakness or numbness in leg Musculoskeletal:   [] Joint swelling   [] Joint pain   [] Low back pain Hematologic:  [] Easy bruising  [] Easy bleeding   [] Hypercoagulable state   [] Anemic Gastrointestinal:  [] Diarrhea   [] Vomiting  [] Gastroesophageal reflux/heartburn   [] Difficulty swallowing. Genitourinary:  [] Chronic kidney disease   [] Difficult urination  [] Frequent urination   [] Blood in urine Skin:  [] Rashes   [] Ulcers  Psychological:  [] History of anxiety   []  History of major depression.  Physical Examination  There were no vitals filed for this visit. There is no height or weight on file to calculate BMI. Gen: WD/WN, NAD Head: [place]/AT, No temporalis wasting.  Ear/Nose/Throat: Hearing grossly intact, nares w/o erythema or drainage, pinna without lesions Eyes: PER, EOMI, sclera nonicteric.  Neck: Supple, no gross masses.  No JVD.  Pulmonary:  Good air movement, no audible wheezing, no use of accessory muscles.  Cardiac: RRR, precordium not hyperdynamic. Vascular:  scattered varicosities present bilaterally.  Moderate venous stasis changes to the legs bilaterally.  2+ soft pitting edema. CEAP C4sEpAsPr   Vessel Right Left  Radial Palpable Palpable  Gastrointestinal: soft, non-distended. No guarding/no peritoneal signs.  Musculoskeletal:  M/S 5/5 throughout.  No deformity.  Neurologic: CN 2-12 intact. Pain and light touch intact in extremities.  Symmetrical.  Speech is fluent. Motor exam as listed above. Psychiatric: Judgment intact, Mood & affect appropriate for pt's clinical situation. Dermatologic: Venous rashes no ulcers noted.  No changes consistent with cellulitis. Lymph : No lichenification or skin changes of chronic lymphedema.  CBC Lab Results  Component Value Date   WBC 18.1 (H) 09/20/2023   HGB 12.8 09/20/2023   HCT 38.5 09/20/2023   MCV 92.3 09/20/2023   PLT 537 (H) 09/20/2023    BMET    Component Value Date/Time   NA 136 09/20/2023 1215   NA 142 10/22/2019 1610   K 3.8 09/20/2023 1215   CL 100 09/20/2023 1215   CO2 24 09/20/2023 1215   GLUCOSE 99 09/20/2023 1215   BUN 14 09/20/2023 1215   BUN 15 10/22/2019 1610  CREATININE 0.79 09/20/2023 1215   CALCIUM 9.6 09/20/2023 1215   GFRNONAA >60 09/20/2023 1215   GFRAA 84 10/22/2019 1610   CrCl cannot be calculated (Unknown ideal weight.).  COAG Lab Results  Component Value Date   INR 1.1 09/20/2023    Radiology MM 3D SCREENING MAMMOGRAM BILATERAL BREAST  Result Date: 09/20/2023 CLINICAL DATA:  Screening. EXAM: DIGITAL SCREENING BILATERAL MAMMOGRAM WITH TOMOSYNTHESIS AND CAD TECHNIQUE: Bilateral screening digital craniocaudal and mediolateral oblique mammograms were obtained. Bilateral screening digital breast tomosynthesis was performed. The images were evaluated with computer-aided detection. COMPARISON:  Previous exam(s). ACR Breast Density Category b: There are scattered areas of fibroglandular density. FINDINGS: There are no findings suspicious for malignancy. IMPRESSION: No mammographic evidence of malignancy. A result letter of this screening mammogram will be mailed directly to the patient. RECOMMENDATION: Screening mammogram in one year. (Code:SM-B-01Y) BI-RADS CATEGORY  1: Negative. Electronically Signed   By: Sherian Rein M.D.   On:  09/20/2023 14:04   US Venous Img Lower Unilateral Right (DVT)  Result Date: 09/20/2023 CLINICAL DATA:  RIGHT lower extremity swelling R22.41 EXAM: RIGHT LOWER EXTREMITY VENOUS DOPPLER ULTRASOUND TECHNIQUE: Gray-scale sonography with compression, as well as color and duplex ultrasound, were performed to evaluate the deep venous system(s) from the level of the common femoral vein through the popliteal and proximal calf veins. COMPARISON:  RIGHT lower extremity venous duplex, 03/13/2021. CT AP, 08/20/2019. FINDINGS: VENOUS Normal compressibility of the common femoral and superficial femoral veins. Visualized portions of profunda femoral vein and great saphenous vein unremarkable. Heterogeneously-echogenic filling defect with partial compressibility of the popliteal vein, and occlusive with noncompressibility within the imaged calf veins. Limited views of the contralateral common femoral vein are unremarkable. OTHER No evidence of superficial thrombophlebitis or abnormal fluid collection. Limitations: none IMPRESSION: Examination is POSITIVE for acute RIGHT lower extremity DVT, within the popliteal and calf veins. Roanna Banning, MD [facility] Electronically Signed   By: Roanna Banning M.D.   On: 09/20/2023 11:26     Assessment/Plan 1. Acute deep vein thrombosis (DVT) of popliteal vein of right lower extremity (HCC) Recommend:   No surgery or intervention at this point in time.  IVC filter is not indicated at present.  The patient is on anticoagulation   Elevation was stressed, use of a recliner was discussed.  I have reviewed with the patient DVT and post phlebitic changes such as swelling and why it  causes symptoms such as pain.  I recommended to the patient to wear graduated compression stockings, beginning after three full days of anticoagulation.  Graduated compression should be worn on a daily basis. The patient should wear compression  beginning first thing in the morning and removing them in the evening. The patient is instructed specifically not to sleep in the stockings.  In addition, behavioral modification including elevation during the day and avoidance of prolonged dependency will be initiated.    The patient will continue anticoagulation for now as there have not been any problems or complications from anticoagulation therapy at this point.  The patient will follow-up with me with noninvasive studies as ordered.  - VAS [place] LOWER EXTREMITY VENOUS (DVT); Future  2. Chronic venous insufficiency No surgery or intervention at this point in time.   The patient is CEAP C4sEpAsPr   I have discussed with the patient venous insufficiency and why it  causes symptoms. I have discussed with the patient the chronic skin changes that accompany venous insufficiency and the long  term sequela such as infection and ulceration.  Patient will begin wearing graduated compression stockings or compression wraps on a daily basis.  The patient will put the compression on first thing in the morning and removing them in the evening. The patient is instructed specifically not to sleep in the compression.    In addition, behavioral modification including several periods of elevation of the lower extremities during the day will be continued. I have demonstrated that proper elevation is a position with the ankles at heart level.  The patient is instructed to begin routine exercise, especially walking on a daily basis  The patient will be assessed for a Lymph Pump depending on the effectiveness of conservative therapy and the control of the associated lymphedema.  3. Essential hypertension Continue antihypertensive medications as already ordered, these medications have been reviewed and there are no changes at this time.  4. Hypercholesterolemia Continue statin as ordered and reviewed, no changes at this time  5. Osteoarthritis of spine with  radiculopathy, lumbar region Continue medications to treat the patient's degenerative disease as already ordered, these medications have been reviewed and there are no changes at this time.  Continued activity and therapy was stressed. - Ambulatory referral to Neurosurgery    Levora Dredge, MD  10/07/2023 1:22 PM

## 2023-10-08 ENCOUNTER — Ambulatory Visit: Payer: 59 | Attending: Infectious Diseases | Admitting: Infectious Diseases

## 2023-10-08 ENCOUNTER — Other Ambulatory Visit
Admission: RE | Admit: 2023-10-08 | Discharge: 2023-10-08 | Disposition: A | Discharge status: Home / Self Care | Payer: 59 | Attending: Infectious Diseases | Admitting: Infectious Diseases

## 2023-10-08 ENCOUNTER — Encounter: Payer: Self-pay | Admitting: Infectious Diseases

## 2023-10-08 VITALS — BP 151/86 | HR 98 | Temp 96.6°F | Ht 64.0 in | Wt 171.0 lb

## 2023-10-08 DIAGNOSIS — I82431 Acute embolism and thrombosis of right popliteal vein: Secondary | ICD-10-CM | POA: Insufficient documentation

## 2023-10-08 DIAGNOSIS — M00061 Staphylococcal arthritis, right knee: Secondary | ICD-10-CM | POA: Diagnosis not present

## 2023-10-08 DIAGNOSIS — E785 Hyperlipidemia, unspecified: Secondary | ICD-10-CM | POA: Insufficient documentation

## 2023-10-08 DIAGNOSIS — Z7901 Long term (current) use of anticoagulants: Secondary | ICD-10-CM | POA: Insufficient documentation

## 2023-10-08 DIAGNOSIS — I10 Essential (primary) hypertension: Secondary | ICD-10-CM | POA: Insufficient documentation

## 2023-10-08 DIAGNOSIS — B9561 Methicillin susceptible Staphylococcus aureus infection as the cause of diseases classified elsewhere: Secondary | ICD-10-CM | POA: Diagnosis not present

## 2023-10-08 LAB — COMPREHENSIVE METABOLIC PANEL
ALT: 31 U/L (ref 0–44)
AST: 28 U/L (ref 15–41)
Albumin: 3.4 g/dL — ABNORMAL LOW (ref 3.5–5.0)
Alkaline Phosphatase: 45 U/L (ref 38–126)
Anion gap: 11 (ref 5–15)
BUN: 15 mg/dL (ref 8–23)
CO2: 26 mmol/L (ref 22–32)
Calcium: 10.2 mg/dL (ref 8.9–10.3)
Chloride: 101 mmol/L (ref 98–111)
Creatinine, Ser: 0.85 mg/dL (ref 0.44–1.00)
GFR, Estimated: >60 mL/min (ref >60)
Glucose, Bld: 93 mg/dL (ref 70–99)
Potassium: 4.2 mmol/L (ref 3.5–5.1)
Sodium: 138 mmol/L (ref 135–145)
Total Bilirubin: 0.4 mg/dL (ref <1.2)
Total Protein: 8.4 g/dL — ABNORMAL HIGH (ref 6.5–8.1)

## 2023-10-08 LAB — CBC WITH DIFFERENTIAL/PLATELET
Abs Immature Granulocytes: 0.05 10*3/uL (ref 0.00–0.07)
Basophils Absolute: 0.1 10*3/uL (ref 0.0–0.1)
Basophils Relative: 1 %
Eosinophils Absolute: 0.3 10*3/uL (ref 0.0–0.5)
Eosinophils Relative: 2 %
HCT: 36.9 % (ref 36.0–46.0)
Hemoglobin: 12 g/dL (ref 12.0–15.0)
Immature Granulocytes: 0 %
Lymphocytes Relative: 20 %
Lymphs Abs: 2.6 10*3/uL (ref 0.7–4.0)
MCH: 29.5 pg (ref 26.0–34.0)
MCHC: 32.5 g/dL (ref 30.0–36.0)
MCV: 90.7 fL (ref 80.0–100.0)
Monocytes Absolute: 0.5 10*3/uL (ref 0.1–1.0)
Monocytes Relative: 4 %
Neutro Abs: 9.6 10*3/uL — ABNORMAL HIGH (ref 1.7–7.7)
Neutrophils Relative %: 73 %
Platelets: 714 10*3/uL — ABNORMAL HIGH (ref 150–400)
RBC: 4.07 MIL/uL (ref 3.87–5.11)
RDW: 13 % (ref 11.5–15.5)
WBC: 13.1 10*3/uL — ABNORMAL HIGH (ref 4.0–10.5)
nRBC: 0 % (ref 0.0–0.2)

## 2023-10-08 LAB — SEDIMENTATION RATE: Sed Rate: 117 mm/h — ABNORMAL HIGH (ref 0–30)

## 2023-10-08 LAB — C-REACTIVE PROTEIN: CRP: 4.7 mg/dL — ABNORMAL HIGH (ref <1.0)

## 2023-10-08 NOTE — Patient Instructions (Signed)
VISIT SUMMARY:  During today's visit, we discussed your ongoing issues with joint infection, CPPD (pseudogout), deep vein thrombosis (DVT), and recent neck stiffness. We reviewed your current treatments and made some adjustments to help manage your symptoms more effectively.  YOUR PLAN:  -JOINT INFECTION: A joint infection is an infection in the joint that can cause pain, swelling, and other symptoms. You are currently taking Keflex, but due to side effects, we are reducing the dosage to 500mg  every six hours . complete the antibiotic you have .Marland Kitchen We will also check your inflammatory markers to see how well the treatment is working.  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 We are referring you to a rheumatologist for specialized care.  -DEEP VEIN THROMBOSIS (DVT): DVT is a blood clot in a deep vein, usually in the legs. You should continue taking Eliquis as prescribed and keep using compression socks and elevating your foot to manage swelling.  -NECK STIFFNESS: Your recent neck stiffness may be due to prolonged bed rest. We recommend using Voltaren gel and a heat pad to relieve the symptoms. Follow up with your PCP if it does not improve  INSTRUCTIONS:  Please follow up in December after you complete your antibiotic course. Continue with your current treatments and follow the new recommendations. If you experience any worsening symptoms or new issues, contact our office immediately.

## 2023-10-08 NOTE — Progress Notes (Signed)
NAME: Patricia Callahan  DOB: [birth year]  MRN: [id number]  Date/Time: 10/08/2023 9:55 AM   Subjective:   Discussed the use of AI scribe software for clinical note transcription with the patient, who gave verbal consent to proceed.  Follow up after her visit on 09/24/23 for rt knee septic arthritis due to MSSA The patient, with a history of CPPD (pseudogout),MSSA septic arthritis, and a blood clot, presents for follow up- She is on keflex 1 gram PO Q 6. Some side effects , including feeling foggy and worse overall. The patient reports that these symptoms are more pronounced when taking two antibiotic pills every six hours, and prefers the previous dosage of one pill Q 6. The patient also reports stiffness in the knee, but no pain. The stiffness is worse in the morning and improves with movement. The patient has been managing the stiffness with heat and cold therapy, but has not been receiving physical therapy. The patient also reports a recent issue with a stiff neck, which lasted for about three days and has since improved. The patient has been wearing compression socks to manage swelling in the foot due to the blood clot, and reports that this has improved. The patient has been taking Eliquis for the blood clot and is due for an ultrasound in five months.  Following taken from last visit note     Patricia Callahan is a 62 y.o. 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 As per patient a few days prior to that she had procedure done on the chest lesion by derm  On 10/3 pt underwent arthroscopic debridement by DrBowers. Cell count was 1776- positive for cppd  crystals Culture was negative She continued Doxy As the knee continued to be swollen along with worsening leg swelling she had another aspiration on 10/18 and culture was negative. She had ultrasound that day and it showed popliteal and calf vein DVT. She was referred to ED and was starte don eliquis and sent home from ED on 10/19. She was started on Keflex on 10/16 by Dr.Bowers Pt says her knee pain is much better and is able to bend to knee upto 90deg Her ankle and leg swollen  Past Medical History:  Diagnosis Date   Crohn's disease (HCC)    External hemorrhoid    GERD (gastroesophageal reflux disease)    OCC TUMS PRN   Hypercholesteremia    Hypertension    Plantar fasciitis of left foot    Vasomotor symptoms due to menopause     Past Surgical History:  Procedure Laterality Date   COLON SURGERY     COLONOSCOPY  2015   normal, was advised to return in 10 years   COLONOSCOPY N/A 12/05/2022   Procedure: COLONOSCOPY;  Surgeon: Toledo, Boykin Nearing, MD;  Location: [facility] ENDOSCOPY;  Service: Gastroenterology;  Laterality: N/A;   COLONOSCOPY WITH PROPOFOL N/A 09/10/2019   Procedure: COLONOSCOPY WITH PROPOFOL;  Surgeon: Wyline Mood, MD;  Location: [facility] ENDOSCOPY;  Service: Gastroenterology;  Laterality: N/A;   COLOSTOMY REVISION  10/08/2019   Procedure: COLON RESECTION RIGHT;  Surgeon: Tonna Boehringer,  Isami, DO;  Location: [facility] ORS;  Service: General;;   KNEE SURGERY Right    LAPAROSCOPIC RIGHT COLECTOMY Right 10/08/2019   Procedure: ATTEMPTED LAPAROSCOPIC RIGHT COLECTOMY;  Surgeon: Sung Amabile, DO;  Location: [facility] ORS;  Service: General;  Laterality: Right;   PLANTAR FASCIA RELEASE Left 04/28/2020   Procedure: ENDOSCOPIC PLANTAR FASCIA RELEASE WITH TOPAZ LEFT;  Surgeon: Recardo Evangelist, DPM;  Location: [facility];  Service: Podiatry;  Laterality: Left;  LMA WITH POP BLOCK    Social History   Socioeconomic History   Marital status: Widowed    Spouse name: Not on file   Number of  children: 0   Years of education: 14   Highest education level: Not on file  Occupational History   Not on file  Tobacco Use   Smoking status: Never   Smokeless tobacco: Never  Vaping Use   Vaping status: Never Used  Substance and Sexual Activity   Alcohol use: Not Currently    Comment: SOCIAL   Drug use: No   Sexual activity: Yes    Birth control/protection: None, Post-menopausal  Other Topics Concern   Not on file  Social History Narrative   Not on file   Social Determinants of Health   Financial Resource Strain: Low Risk  (01/10/2023)   Received from [facility], [facility]   Overall Financial Resource Strain (CARDIA)    Difficulty of Paying Living Expenses: Not very hard  Food Insecurity: No Food Insecurity (01/10/2023)   Received from [facility], [facility]   Hunger Vital Sign    Worried About Running Out of Food in the Last Year: Never true    Ran Out of Food in the Last Year: Never true  Transportation Needs: No Transportation Needs (01/10/2023)   Received from [facility], [facility]   [facility] - Transportation    In the past 12 months, has lack of transportation kept you from medical appointments or from getting medications?: No    Lack of Transportation (Non-Medical): No  Physical Activity: Inactive (03/28/2018)   Exercise Vital Sign    Days of Exercise per Week: 0 days    Minutes of Exercise per Session: 0 min  Stress: No Stress Concern Present (03/28/2018)   [employer] [facility] [employer] [facility] - Occupational Stress Questionnaire    Feeling of Stress : Not at all  Social Connections: Not on file  Intimate Partner Violence: Not on file    Family History  Problem Relation Age of Onset   Diabetes Mother    Hypertension Mother    Hyperlipidemia Mother    Hypertension Father    Hyperlipidemia Father    Dementia Father    Prostate cancer Father     Heart disease Father    Breast cancer Neg Hx    No Known Allergies I? Current Outpatient Medications  Medication Sig Dispense Refill   APIXABAN (ELIQUIS) VTE STARTER PACK (10MG  AND 5MG ) Take as directed on package: start with two-5mg  tablets twice daily for 7 days. On day 8, switch to one-5mg  tablet twice daily. 74 each 0   Ascorbic Acid (VITAMIN C) 1000 MG tablet Take 1,000 mg by mouth daily.     calcium carbonate (TUMS - DOSED IN MG ELEMENTAL CALCIUM) 500 MG chewable tablet Chew 1 tablet by mouth as needed for indigestion or heartburn.     Cephalexin 500 MG tablet Take 2 tablets (1,000 mg total) by mouth 4 (four)  times daily. 240 tablet 0   Cholecalciferol (VITAMIN D3) 50 MCG (2000 UT) TABS Take 2,000 Units by mouth daily.     estradiol (ESTRACE) 0.5 MG tablet TAKE 1 TABLET BY MOUTH EVERY DAY 30 tablet 0   fexofenadine (ALLEGRA) 180 MG tablet Take 180 mg by mouth daily.     HYDROcodone-acetaminophen (NORCO/VICODIN) 5-325 MG tablet Take 1 tablet by mouth every 6 (six) hours as needed. 15 tablet 0   Multiple Vitamin (MULTIVITAMIN WITH MINERALS) TABS tablet Take 1 tablet by mouth daily.     norethindrone (AYGESTIN) 5 MG tablet Take 0.5 tablets (2.5 mg total) by mouth daily. 45 tablet 3   nystatin (MYCOSTATIN/NYSTOP) powder Apply topically daily.     Probiotic Product (DIGESTIVE ADVANTAGE GUMMIES) CHEW Chew by mouth.     simvastatin (ZOCOR) 20 MG tablet Take 20 mg by mouth at bedtime.      vitamin B-12 (CYANOCOBALAMIN) 1000 MCG tablet Take 1,000 mcg by mouth daily.     metoprolol succinate (TOPROL-XL) 25 MG 24 hr tablet Take by mouth.     No current facility-administered medications for this visit.     Abtx:  Anti-infectives (From admission, onward)    None       REVIEW OF SYSTEMS:  Const: negative fever, negative chills, negative weight loss Eyes: negative diplopia or visual changes, negative eye pain ENT: negative coryza, negative sore throat Resp: negative cough, hemoptysis,  dyspnea Cards: negative for chest pain, palpitations, lower extremity edema GU: negative for frequency, dysuria and hematuria GI: Negative for abdominal pain, diarrhea, bleeding, constipation Skin: negative for rash and pruritus Heme: negative for easy bruising and gum/nose bleeding MS: rt leg pain and swelling Neurolo:negative for headaches, dizziness, vertigo, memory problems  Psych: negative for feelings of anxiety, depression  Endocrine: negative for thyroid, diabetes Allergy/Immunology- negative for any medication or food allergies  Objective:  VITALS:  BP (!) 151/86   Pulse 98   Temp (!) 96.6 F (35.9 C) (Temporal)   Ht 5\' 4"  (1.626 m)   Wt 171 lb (77.6 kg)   BMI 29.35 kg/m   PHYSICAL EXAM:  General: Alert, cooperative, no distress, appears stated age.  Head: Normocephalic, without obvious abnormality, atraumatic. Eyes: Conjunctivae clear, anicteric sclerae. Pupils are equal ENT Nares normal. No drainage or sinus tenderness. Lips, mucosa, and tongue normal. No Thrush Neck: Supple, symmetrical, no adenopathy, thyroid: non tender no carotid bruit and no JVD. Back: No CVA tenderness. Lungs: Clear to auscultation bilaterally. No Wheezing or Rhonchi. No rales. Heart: Regular rate and rhythm, no murmur, rub or gallop. Abdomen: did not examine Extremities: rt leg swollen  Skin: No rashes or lesions. Or bruising Lymph: Cervical, supraclavicular normal. Neurologic: Grossly non-focal Pertinent Labs Lab Results CBC    Component Value Date/Time   WBC 18.1 (H) 09/20/2023 1215   RBC 4.17 09/20/2023 1215   HGB 12.8 09/20/2023 1215   HGB 14.1 11/30/2019 1419   HCT 38.5 09/20/2023 1215   HCT 41.8 11/30/2019 1419   PLT 537 (H) 09/20/2023 1215   PLT 506 (H) 11/30/2019 1419   MCV 92.3 09/20/2023 1215   MCV 92 11/30/2019 1419   MCH 30.7 09/20/2023 1215   MCHC 33.2 09/20/2023 1215   RDW 13.2 09/20/2023 1215   RDW 12.3 11/30/2019 1419   LYMPHSABS 2.6 09/20/2023 1215    MONOABS 1.3 (H) 09/20/2023 1215   EOSABS 0.0 09/20/2023 1215   BASOSABS 0.1 09/20/2023 1215       Latest Ref Rng & Units 09/20/2023  12:15 PM 11/30/2019    2:19 PM 10/22/2019    4:10 PM  CMP  Glucose 70 - 99 mg/dL 99   78   BUN 8 - 23 mg/dL 14   15   Creatinine 7.06 - 1.00 mg/dL 2.37   6.28   Sodium 315 - 145 mmol/L 136   142   Potassium 3.5 - 5.1 mmol/L 3.8   4.2   Chloride 98 - 111 mmol/L 100   102   CO2 22 - 32 mmol/L 24   22   Calcium 8.9 - 10.3 mg/dL 9.6   17.6   Total Protein 6.0 - 8.5 g/dL  7.2  7.6   Total Bilirubin 0.0 - 1.2 mg/dL  <1.6  0.2   Alkaline Phos 39 - 117 IU/L  53  65   AST 0 - 40 IU/L  21  19   ALT 0 - 32 IU/L  13  14    ESR 1/18 59 CRP 48   Microbiology: 9/30 synovial fluid- MSSA 10/3 culture neg 10/18 culture neg  ? 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

## 2023-10-09 ENCOUNTER — Telehealth (INDEPENDENT_AMBULATORY_CARE_PROVIDER_SITE_OTHER): Payer: Self-pay | Admitting: Vascular Surgery

## 2023-10-09 NOTE — Telephone Encounter (Signed)
Patient saw Dr. Avon Gully, MD. Dr. Elvera Lennox wanted to know if it would be ok for patient to do Physical Therapy for Knee? Patient also called asking about back doctor referral and meds ordered. She was seen on 10/31 [facility]. She has not gotten a call from her [facility] yet.

## 2023-10-13 ENCOUNTER — Encounter (INDEPENDENT_AMBULATORY_CARE_PROVIDER_SITE_OTHER): Payer: Self-pay | Admitting: Vascular Surgery

## 2023-10-13 DIAGNOSIS — M47816 Spondylosis without myelopathy or radiculopathy, lumbar region: Secondary | ICD-10-CM | POA: Insufficient documentation

## 2023-10-16 ENCOUNTER — Telehealth: Payer: Self-pay

## 2023-10-16 DIAGNOSIS — M25471 Effusion, right ankle: Secondary | ICD-10-CM | POA: Diagnosis not present

## 2023-10-16 DIAGNOSIS — I82431 Acute embolism and thrombosis of right popliteal vein: Secondary | ICD-10-CM | POA: Diagnosis not present

## 2023-10-16 DIAGNOSIS — I872 Venous insufficiency (chronic) (peripheral): Secondary | ICD-10-CM | POA: Diagnosis not present

## 2023-10-16 DIAGNOSIS — M1120 Other chondrocalcinosis, unspecified site: Secondary | ICD-10-CM | POA: Diagnosis not present

## 2023-10-16 DIAGNOSIS — M00061 Staphylococcal arthritis, right knee: Secondary | ICD-10-CM | POA: Diagnosis not present

## 2023-10-16 NOTE — Telephone Encounter (Signed)
-----   Message from [facility] sent at 10/09/2023  8:54 AM EST ----- Can you let her know that the lab work done yesterday showed increased in inflammatory markers like ESR and CRP- she has to speak with Dr.Hedrick her pcp to take care of CPPD disease of the knee. thx ----- Message ----- From: Leory Plowman, Lab [facility] [place] Sent: 10/08/2023  11:23 AM EST To: Lynn Ito, MD

## 2023-10-16 NOTE — Telephone Encounter (Signed)
Patient returned call. Patient informed of labs and will be seeing pcp today. Patient reports a referral was placed for rheumatology, but no one as called. Patient advised to relay inflammatory marker results to her pcp today. Anjana Cheek Jonathon Resides, CMA

## 2023-10-16 NOTE — Telephone Encounter (Signed)
2nd attempt to contact the patient. LVM for her to call back

## 2023-10-17 ENCOUNTER — Other Ambulatory Visit (INDEPENDENT_AMBULATORY_CARE_PROVIDER_SITE_OTHER): Payer: Self-pay

## 2023-10-17 MED ORDER — APIXABAN 5 MG PO TABS: 5.0000 mg | ORAL_TABLET | Freq: Two times a day (BID) | ORAL | 5 refills | Status: AC

## 2023-10-18 NOTE — Progress Notes (Unsigned)
Referring Physician:  Renford Dills, MD [address] [place],  [place] [address]  Primary Physician:  Jerl Mina, MD  History of Present Illness: 10/22/2023 [name] has a history of pseudogout, DVT, HTN, hyperlipidemia, right knee septic arthritis with MSSA, Crohn's disease, GERD.   Recently seen by vascular for DVT of right leg. She is also seeing ID for right knee septic arthritis due to MSSA.   2 month history of LBP- she saw chiropractor and her pain got worse. She then had above right knee septic arthritis and DVT. She is seeing infectious disease.   She currently has no LBP, but would like to establish care. She has history of chronic LBP x years. When she has flare up, she generally does not have leg pain.   No numbness, tingling, or weakness in her legs.   She has 4 week history of intermittent posterior neck pain and stiffness. No pain in her arms. No numbness, tingling, or weakness in her arms.   She is on ELIQUIS.   Bowel/Bladder Dysfunction: none  Conservative measures:  Physical therapy: 1 treatment at [facility], has done years ago with relief.  Multimodal medical therapy including regular antiinflammatories: Prenisone, Hydrocodone,  Injections: no epidural steroid injections  Past Surgery: no spinal surgery  [name] has no symptoms of cervical myelopathy.  The symptoms are causing a significant impact on the patient's life.   Review of Systems:  A 10 point review of systems is negative, except for the pertinent positives and negatives detailed in the HPI.  Past Medical History: Past Medical History:  Diagnosis Date   Crohn's disease (HCC)    External hemorrhoid    GERD (gastroesophageal reflux disease)    OCC TUMS PRN   Hypercholesteremia    Hypertension    Plantar fasciitis of left foot    Vasomotor symptoms due to menopause     Past Surgical History: Past Surgical History:  Procedure Laterality Date   COLON SURGERY      COLONOSCOPY  2015   normal, was advised to return in 10 years   COLONOSCOPY N/A 12/05/2022   Procedure: COLONOSCOPY;  Surgeon: Toledo, Boykin Nearing, MD;  Location: [facility] ENDOSCOPY;  Service: Gastroenterology;  Laterality: N/A;   COLONOSCOPY WITH PROPOFOL N/A 09/10/2019   Procedure: COLONOSCOPY WITH PROPOFOL;  Surgeon: Wyline Mood, MD;  Location: [facility] ENDOSCOPY;  Service: Gastroenterology;  Laterality: N/A;   COLOSTOMY REVISION  10/08/2019   Procedure: COLON RESECTION RIGHT;  Surgeon: Sung Amabile, DO;  Location: [facility] ORS;  Service: General;;   KNEE SURGERY Right    LAPAROSCOPIC RIGHT COLECTOMY Right 10/08/2019   Procedure: ATTEMPTED LAPAROSCOPIC RIGHT COLECTOMY;  Surgeon: Sung Amabile, DO;  Location: [facility] ORS;  Service: General;  Laterality: Right;   PLANTAR FASCIA RELEASE Left 04/28/2020   Procedure: ENDOSCOPIC PLANTAR FASCIA RELEASE WITH TOPAZ LEFT;  Surgeon: Recardo Evangelist, DPM;  Location: [facility];  Service: Podiatry;  Laterality: Left;  LMA WITH POP BLOCK    Allergies: Allergies as of 10/22/2023   (No Known Allergies)    Medications: Outpatient Encounter Medications as of 10/22/2023  Medication Sig   apixaban (ELIQUIS) 5 MG TABS tablet Take 1 tablet (5 mg total) by mouth 2 (two) times daily.   Ascorbic Acid (VITAMIN C) 1000 MG tablet Take 1,000 mg by mouth daily.   calcium carbonate (TUMS - DOSED IN MG ELEMENTAL CALCIUM) 500 MG chewable tablet Chew 1 tablet by mouth as needed for indigestion or heartburn.  Cephalexin 500 MG tablet Take 2 tablets (1,000 mg total) by mouth 4 (four) times daily.   Cholecalciferol (VITAMIN D3) 50 MCG (2000 UT) TABS Take 2,000 Units by mouth daily.   estradiol (ESTRACE) 0.5 MG tablet TAKE 1 TABLET BY MOUTH EVERY DAY   fexofenadine (ALLEGRA) 180 MG tablet Take 180 mg by mouth daily.   HYDROcodone-acetaminophen (NORCO/VICODIN) 5-325 MG tablet Take 1 tablet by mouth every 6 (six) hours as needed.   Multiple Vitamin (MULTIVITAMIN WITH  MINERALS) TABS tablet Take 1 tablet by mouth daily.   norethindrone (AYGESTIN) 5 MG tablet Take 0.5 tablets (2.5 mg total) by mouth daily.   nystatin (MYCOSTATIN/NYSTOP) powder Apply topically daily.   predniSONE (DELTASONE) 10 MG tablet Take by mouth.   Probiotic Product (DIGESTIVE ADVANTAGE GUMMIES) CHEW Chew by mouth.   simvastatin (ZOCOR) 20 MG tablet Take 20 mg by mouth at bedtime.    vitamin B-12 (CYANOCOBALAMIN) 1000 MCG tablet Take 1,000 mcg by mouth daily.   metoprolol succinate (TOPROL-XL) 25 MG 24 hr tablet Take by mouth.   [DISCONTINUED] APIXABAN (ELIQUIS) VTE STARTER PACK (10MG  AND 5MG ) Take as directed on package: start with two-5mg  tablets twice daily for 7 days. On day 8, switch to one-5mg  tablet twice daily.   No facility-administered encounter medications on file as of 10/22/2023.    Social History: Social History   Tobacco Use   Smoking status: Never   Smokeless tobacco: Never  Vaping Use   Vaping status: Never Used  Substance Use Topics   Alcohol use: Not Currently    Comment: SOCIAL   Drug use: No    Family Medical History: Family History  Problem Relation Age of Onset   Diabetes Mother    Hypertension Mother    Hyperlipidemia Mother    Hypertension Father    Hyperlipidemia Father    Dementia Father    Prostate cancer Father    Heart disease Father    Breast cancer Neg Hx     Physical Examination: Vitals:   10/22/23 1350 10/22/23 1421  BP: (!) 172/100 (!) 160/94    General: Patient is well developed, well nourished, calm, collected, and in no apparent distress. Attention to examination is appropriate.  Respiratory: Patient is breathing without any difficulty.   NEUROLOGICAL:     Awake, alert, oriented to person, place, and time.  Speech is clear and fluent. Fund of knowledge is appropriate.   Cranial Nerves: Pupils equal round and reactive to light.  Facial tone is symmetric.    No lower lumbar tenderness.   No abnormal lesions on  exposed skin.   Strength: Side Biceps Triceps Deltoid Interossei Grip Wrist Ext. Wrist Flex.  R 5 5 5 5 5 5 5   L 5 5 5 5 5 5 5    Side Iliopsoas Quads Hamstring PF DF EHL  R 5 5 5 5 5 5   L 5 5 5 5 5 5    Reflexes are 2+ and symmetric at the biceps, brachioradialis, patella and achilles.   Hoffman's is absent.  Clonus is not present.   Bilateral upper and lower extremity sensation is intact to light touch.     She ambulates with a limp favoring her right knee.     Medical Decision Making  Imaging: No cervical or lumbar imaging.   Assessment and Plan: Ms. Skoczylas is a pleasant 62 y.o. female has history of chronic intermittent LBP with no leg pain. Had flare up 2 months ago. Does not have any pain now.  Also with 4 week history of intermittent posterior neck pain and stiffness. No pain in her arms. No numbness, tingling, or weakness in her arms.   No cervical or lumbar imaging.   Treatment options discussed with patient and following plan made:   - Xrays of cervical and lumbar spine ordered.  - Continue to follow up with infectious disease for right septic knee/pseudogout.  - Continue to follow with vascular for right LE DVT.  - Will message her with xrays results. For now, she will f/u prn as she is not in pain.   BP was elevated. No symptoms of chest pain, shortness of breath, blurry vision, or headaches. Advised to recheck her BP at home and call PCP if not improved. If she develops CP, SOB, blurry vision, or headaches, then she will go to ED.     I spent a total of 30 minutes in face-to-face and non-face-to-face activities related to this patient's care today including review of outside records, review of imaging, review of symptoms, physical exam, discussion of differential diagnosis, discussion of treatment options, and documentation.   Thank you for involving me in the care of this patient.   Drake Leach PA-C Dept. of Neurosurgery

## 2023-10-22 ENCOUNTER — Ambulatory Visit
Admission: RE | Admit: 2023-10-22 | Discharge: 2023-10-22 | Disposition: A | Discharge status: Home / Self Care | Payer: 59 | Source: Ambulatory Visit | Attending: Orthopedic Surgery | Admitting: Orthopedic Surgery

## 2023-10-22 ENCOUNTER — Encounter: Payer: Self-pay | Admitting: Orthopedic Surgery

## 2023-10-22 ENCOUNTER — Ambulatory Visit
Admission: RE | Admit: 2023-10-22 | Discharge: 2023-10-22 | Disposition: A | Discharge status: Home / Self Care | Payer: 59 | Attending: Orthopedic Surgery | Admitting: Orthopedic Surgery

## 2023-10-22 ENCOUNTER — Ambulatory Visit (INDEPENDENT_AMBULATORY_CARE_PROVIDER_SITE_OTHER): Payer: 59 | Admitting: Orthopedic Surgery

## 2023-10-22 VITALS — BP 160/94 | Ht 64.0 in | Wt 171.0 lb

## 2023-10-22 DIAGNOSIS — M542 Cervicalgia: Secondary | ICD-10-CM | POA: Diagnosis not present

## 2023-10-22 DIAGNOSIS — M4802 Spinal stenosis, cervical region: Secondary | ICD-10-CM | POA: Diagnosis not present

## 2023-10-22 DIAGNOSIS — M47812 Spondylosis without myelopathy or radiculopathy, cervical region: Secondary | ICD-10-CM | POA: Diagnosis not present

## 2023-10-22 DIAGNOSIS — M549 Dorsalgia, unspecified: Secondary | ICD-10-CM | POA: Diagnosis not present

## 2023-10-22 DIAGNOSIS — M4319 Spondylolisthesis, multiple sites in spine: Secondary | ICD-10-CM | POA: Diagnosis not present

## 2023-10-22 DIAGNOSIS — G8929 Other chronic pain: Secondary | ICD-10-CM | POA: Insufficient documentation

## 2023-10-22 DIAGNOSIS — M47816 Spondylosis without myelopathy or radiculopathy, lumbar region: Secondary | ICD-10-CM | POA: Insufficient documentation

## 2023-10-22 DIAGNOSIS — M50222 Other cervical disc displacement at C5-C6 level: Secondary | ICD-10-CM | POA: Diagnosis not present

## 2023-10-22 DIAGNOSIS — M4316 Spondylolisthesis, lumbar region: Secondary | ICD-10-CM | POA: Diagnosis not present

## 2023-10-22 DIAGNOSIS — M545 Low back pain, unspecified: Secondary | ICD-10-CM

## 2023-10-22 DIAGNOSIS — M438X6 Other specified deforming dorsopathies, lumbar region: Secondary | ICD-10-CM | POA: Diagnosis not present

## 2023-10-22 NOTE — Patient Instructions (Signed)
It was so nice to see you today. Thank you so much for coming in.    I ordered xrays of your neck and back. You can get these at [facility] (building with the white pillars) off of [address]. The address is [address], [place], [place] [address]. You do not need any appointment.   After you have the xrays, it takes 10-14 days for me to get the results back. Once I have them, I will send you a MyChart message.   Your blood pressure was elevated today. I want you to recheck it at home and follow up with your PCP if it remains high. If you have any chest pain, shortness of breath, blurry vision, or headaches then you need to go to ED.    Please do not hesitate to call if you have any questions or concerns. You can also message me in MyChart.   Drake Leach PA-C [phone]     The physicians and staff at [facility] at [facility] are committed to providing excellent care. You may receive a survey asking for feedback about your experience at our office. We value you your feedback and appreciate you taking the time to to fill it out. The [facility] leadership team is also available to discuss your experience in person, feel free to contact us [phone].

## 2023-10-30 ENCOUNTER — Telehealth: Payer: Self-pay

## 2023-10-30 NOTE — Telephone Encounter (Signed)
Patient left a voicemail wanting to inform Dr.Ravishankar she has an appointment with rheumatology but not until January. Patient has been having pain since completing prednisone last week. Patient has contacted her PCP and waiting for response regarding medication to help with pain.   Don Tiu Lesli Albee, CMA

## 2023-10-30 NOTE — Telephone Encounter (Signed)
Patient stated she is still taking Keflex. No longer sees [facility]. Will wait to hear back from PCP

## 2023-11-03 ENCOUNTER — Other Ambulatory Visit: Payer: Self-pay | Admitting: Obstetrics and Gynecology

## 2023-11-03 DIAGNOSIS — Z79890 Hormone replacement therapy (postmenopausal): Secondary | ICD-10-CM

## 2023-11-03 DIAGNOSIS — N951 Menopausal and female climacteric states: Secondary | ICD-10-CM

## 2023-11-05 ENCOUNTER — Other Ambulatory Visit (HOSPITAL_COMMUNITY)
Admission: RE | Admit: 2023-11-05 | Discharge: 2023-11-05 | Disposition: A | Discharge status: Home / Self Care | Payer: 59 | Source: Ambulatory Visit | Attending: Obstetrics and Gynecology | Admitting: Obstetrics and Gynecology

## 2023-11-05 ENCOUNTER — Ambulatory Visit (INDEPENDENT_AMBULATORY_CARE_PROVIDER_SITE_OTHER): Payer: 59 | Admitting: Obstetrics and Gynecology

## 2023-11-05 ENCOUNTER — Encounter: Payer: Self-pay | Admitting: Obstetrics and Gynecology

## 2023-11-05 VITALS — BP 138/85 | HR 94 | Ht 64.0 in | Wt 172.0 lb

## 2023-11-05 DIAGNOSIS — Z01419 Encounter for gynecological examination (general) (routine) without abnormal findings: Secondary | ICD-10-CM

## 2023-11-05 DIAGNOSIS — Z113 Encounter for screening for infections with a predominantly sexual mode of transmission: Secondary | ICD-10-CM

## 2023-11-05 DIAGNOSIS — N951 Menopausal and female climacteric states: Secondary | ICD-10-CM

## 2023-11-05 DIAGNOSIS — Z79890 Hormone replacement therapy (postmenopausal): Secondary | ICD-10-CM

## 2023-11-05 DIAGNOSIS — Z1231 Encounter for screening mammogram for malignant neoplasm of breast: Secondary | ICD-10-CM

## 2023-11-05 MED ORDER — NORETHINDRONE ACETATE 5 MG PO TABS
2.5000 mg | ORAL_TABLET | Freq: Every day | ORAL | 0 refills | Status: AC
Start: 2023-11-05 — End: ?

## 2023-11-05 NOTE — Patient Instructions (Signed)
I value your feedback and you entrusting us with your care. If you get a [facility] patient survey, I would appreciate you taking the time to let us know about your experience today. Thank you! ? ? ?

## 2023-11-05 NOTE — Progress Notes (Signed)
PCP: Jerl Mina, MD   Chief Complaint  Patient presents with   Gynecologic Exam    No concerns    HPI:      [name] is a 62 y.o. G0P0000 whose LMP was No LMP recorded. Patient is postmenopausal., presents today for her annual examination.  Her menses are absent due to menopause, no PMB. No vasomotor sx on HRT. Takes estradiol 0.5 mg daily and norethindrone 2.5 mg (1/2 tab) daily. Hasn't had vasomotor sx in yrs, willing to wean down last yr. Doesn't want vaginal dryness/decreased lubrication so wants to stay on HRT. Diagnosed with DVT LT leg 3 months ago, now on eliquis. Was never told to stop ERT.   Sex activity: single partner, contraception - post menopausal status. She does not have vaginal dryness. No  pain/bleeding. Likes yearly STD testing.   Last Pap: 11/01/22 Results were: no abnormalities /neg HPV DNA.   Last mammogram: 09/18/23 Results were: normal--routine follow-up in 12 months.  There is no FH of breast cancer. There is no FH of ovarian cancer. The patient does not do self-breast exams.  Colonoscopy: 1/24 at [facility], repeat due after 3 yrs.  2020 with polyp; Repeat due after 5 years.  Followed by [facility] for Crohns vs inflammation, although no evidence of Crohns.   Tobacco use: The patient denies current or previous tobacco use. Alcohol use: none No drug use Exercise: min active recently due to knee infection/DVT/ankle swelling/pseudogout dx.  She does get adequate calcium and Vitamin D in her diet.  Labs with PCP.   Past Medical History:  Diagnosis Date   Crohn's disease (HCC)    External hemorrhoid    GERD (gastroesophageal reflux disease)    OCC TUMS PRN   Hypercholesteremia    Hypertension    Plantar fasciitis of left foot    Vasomotor symptoms due to menopause     Past Surgical History:  Procedure Laterality Date   COLON SURGERY     COLONOSCOPY  2015   normal, was advised to return in 10 years   COLONOSCOPY N/A 12/05/2022   Procedure:  COLONOSCOPY;  Surgeon: Toledo, Boykin Nearing, MD;  Location: [facility] ENDOSCOPY;  Service: Gastroenterology;  Laterality: N/A;   COLONOSCOPY WITH PROPOFOL N/A 09/10/2019   Procedure: COLONOSCOPY WITH PROPOFOL;  Surgeon: Wyline Mood, MD;  Location: [facility] ENDOSCOPY;  Service: Gastroenterology;  Laterality: N/A;   COLOSTOMY REVISION  10/08/2019   Procedure: COLON RESECTION RIGHT;  Surgeon: Sung Amabile, DO;  Location: [facility] ORS;  Service: General;;   KNEE SURGERY Right 09/05/2023   LAPAROSCOPIC RIGHT COLECTOMY Right 10/08/2019   Procedure: ATTEMPTED LAPAROSCOPIC RIGHT COLECTOMY;  Surgeon: Sung Amabile, DO;  Location: [facility] ORS;  Service: General;  Laterality: Right;   PLANTAR FASCIA RELEASE Left 04/28/2020   Procedure: ENDOSCOPIC PLANTAR FASCIA RELEASE WITH TOPAZ LEFT;  Surgeon: Recardo Evangelist, DPM;  Location: [facility];  Service: Podiatry;  Laterality: Left;  LMA WITH POP BLOCK    Family History  Problem Relation Age of Onset   Diabetes Mother    Hypertension Mother    Hyperlipidemia Mother    Hypertension Father    Hyperlipidemia Father    Dementia Father    Prostate cancer Father    Heart disease Father    Breast cancer Neg Hx     Social History   Socioeconomic History   Marital status: Widowed    Spouse name: Not on file   Number of children: 0   Years of education:  14   Highest education level: Not on file  Occupational History   Not on file  Tobacco Use   Smoking status: Never   Smokeless tobacco: Never  Vaping Use   Vaping status: Never Used  Substance and Sexual Activity   Alcohol use: Yes    Comment: SOCIAL   Drug use: No   Sexual activity: Yes    Birth control/protection: Post-menopausal  Other Topics Concern   Not on file  Social History Narrative   Not on file   Social Determinants of Health   Financial Resource Strain: Low Risk  (01/10/2023)   Received from [facility], [facility]   Overall Financial Resource Strain  (CARDIA)    Difficulty of Paying Living Expenses: Not very hard  Food Insecurity: No Food Insecurity (01/10/2023)   Received from [facility], [facility]   Hunger Vital Sign    Worried About Running Out of Food in the Last Year: Never true    Ran Out of Food in the Last Year: Never true  Transportation Needs: No Transportation Needs (01/10/2023)   Received from [facility], [facility]   [facility] - Transportation    In the past 12 months, has lack of transportation kept you from medical appointments or from getting medications?: No    Lack of Transportation (Non-Medical): No  Physical Activity: Inactive (03/28/2018)   Exercise Vital Sign    Days of Exercise per Week: 0 days    Minutes of Exercise per Session: 0 min  Stress: No Stress Concern Present (03/28/2018)   [employer] of [employer]ational [employer] - Occupational Stress Questionnaire    Feeling of Stress : Not at all  Social Connections: Not on file  Intimate Partner Violence: Not on file     Current Outpatient Medications:    apixaban (ELIQUIS) 5 MG TABS tablet, Take 1 tablet (5 mg total) by mouth 2 (two) times daily., Disp: 60 tablet, Rfl: 5   Ascorbic Acid (VITAMIN C) 1000 MG tablet, Take 1,000 mg by mouth daily., Disp: , Rfl:    calcium carbonate (TUMS - DOSED IN MG ELEMENTAL CALCIUM) 500 MG chewable tablet, Chew 1 tablet by mouth as needed for indigestion or heartburn., Disp: , Rfl:    Cephalexin 500 MG tablet, Take 2 tablets (1,000 mg total) by mouth 4 (four) times daily., Disp: 240 tablet, Rfl: 0   Cholecalciferol (VITAMIN D3) 50 MCG (2000 UT) TABS, Take 2,000 Units by mouth daily., Disp: , Rfl:    colchicine 0.6 MG tablet, Take by mouth., Disp: , Rfl:    fexofenadine (ALLEGRA) 180 MG tablet, Take 180 mg by mouth daily., Disp: , Rfl:    HYDROcodone-acetaminophen (NORCO/VICODIN) 5-325 MG tablet, Take 1 tablet by mouth every 6 (six) hours as needed., Disp: 15  tablet, Rfl: 0   meloxicam (MOBIC) 15 MG tablet, Take 15 mg by mouth daily., Disp: , Rfl:    Multiple Vitamin (MULTIVITAMIN WITH MINERALS) TABS tablet, Take 1 tablet by mouth daily., Disp: , Rfl:    nystatin (MYCOSTATIN/NYSTOP) powder, Apply topically daily., Disp: , Rfl:    Probiotic Product (DIGESTIVE ADVANTAGE GUMMIES) CHEW, Chew by mouth., Disp: , Rfl:    simvastatin (ZOCOR) 20 MG tablet, Take 20 mg by mouth at bedtime. , Disp: , Rfl:    traMADol (ULTRAM) 50 MG tablet, 1-2 po q6 prn pain, Disp: , Rfl:    vitamin B-12 (CYANOCOBALAMIN) 1000 MCG tablet, Take 1,000 mcg by  mouth daily., Disp: , Rfl:    metoprolol succinate (TOPROL-XL) 25 MG 24 hr tablet, Take by mouth., Disp: , Rfl:    norethindrone (AYGESTIN) 5 MG tablet, Take 0.5 tablets (2.5 mg total) by mouth daily., Disp: 45 tablet, Rfl: 0     ROS:  Review of Systems  Constitutional:  Negative for fatigue, fever and unexpected weight change.  Respiratory:  Negative for cough, shortness of breath and wheezing.   Cardiovascular:  Negative for chest pain, palpitations and leg swelling.  Gastrointestinal:  Negative for blood in stool, constipation, diarrhea, nausea and vomiting.  Endocrine: Negative for cold intolerance, heat intolerance and polyuria.  Genitourinary:  Negative for dyspareunia, dysuria, flank pain, frequency, genital sores, hematuria, menstrual problem, pelvic pain, urgency, vaginal bleeding, vaginal discharge and vaginal pain.  Musculoskeletal:  Positive for arthralgias and joint swelling. Negative for back pain and myalgias.  Skin:  Negative for rash.  Neurological:  Negative for dizziness, syncope, light-headedness, numbness and headaches.  Hematological:  Negative for adenopathy.  Psychiatric/Behavioral:  Negative for agitation, confusion, sleep disturbance and suicidal ideas. The patient is not nervous/anxious.    BREAST: No symptoms    Objective: BP 138/85   Pulse 94   Ht 5\' 4"  (1.626 m)   Wt 172 lb (78 kg)    BMI 29.52 kg/m    Physical Exam Constitutional:      Appearance: She is well-developed.  Genitourinary:     Vulva normal.     Right Labia: No rash, tenderness or lesions.    Left Labia: No tenderness, lesions or rash.    No vaginal discharge, erythema or tenderness.      Right Adnexa: not tender and no mass present.    Left Adnexa: not tender and no mass present.    No cervical friability or polyp.     Uterus is not enlarged or tender.  Breasts:    Right: No mass, nipple discharge, skin change or tenderness.     Left: No mass, nipple discharge, skin change or tenderness.  Neck:     Thyroid: No thyromegaly.  Cardiovascular:     Rate and Rhythm: Normal rate and regular rhythm.     Heart sounds: Normal heart sounds. No murmur heard. Pulmonary:     Effort: Pulmonary effort is normal.     Breath sounds: Normal breath sounds.  Abdominal:     Palpations: Abdomen is soft.     Tenderness: There is no abdominal tenderness. There is no guarding or rebound.  Musculoskeletal:        General: Normal range of motion.     Cervical back: Normal range of motion.  Lymphadenopathy:     Cervical: No cervical adenopathy.  Neurological:     General: No focal deficit present.     Mental Status: She is alert and oriented to person, place, and time.     Cranial Nerves: No cranial nerve deficit.  Skin:    General: Skin is warm and dry.  Psychiatric:        Mood and Affect: Mood normal.        Behavior: Behavior normal.        Thought Content: Thought content normal.        Judgment: Judgment normal.  Vitals reviewed.     Assessment/Plan: Encounter for annual routine gynecological examination  Screening for STD (sexually transmitted disease) - Plan: HIV Antibody (routine testing w rflx), RPR, Hepatitis C antibody, Cervicovaginal ancillary only  Encounter for screening mammogram for malignant neoplasm  of breast; pt current on mammo  Vasomotor symptoms due to menopause-- Plan:  norethindrone (AYGESTIN) 5 MG tablet; controlled with HRT. Pt with recent DVT dx. Stop estradiol, then 2-3 months later, start to wean off prog due to age/? Need for meds. Take 1/2 tab every other day for 2-3 months, then stop. Can do veozah if sx recur. F/u prn.   Hormone replacement therapy (HRT) - Plan: norethindrone (AYGESTIN) 5 MG tablet   Meds ordered this encounter  Medications   norethindrone (AYGESTIN) 5 MG tablet    Sig: Take 0.5 tablets (2.5 mg total) by mouth daily.    Dispense:  45 tablet    Refill:  0    Order Specific Question:   Supervising Provider    Answer:   Waymon Budge           GYN counsel breast self exam, mammography screening, menopause, adequate intake of calcium and vitamin D, diet and exercise    F/U  Return in about 1 year (around 11/04/2024).  Franklyn Cafaro B. Daelyn Pettaway, PA-C 11/05/2023 5:18 PM

## 2023-11-06 LAB — HEPATITIS C ANTIBODY: Hep C Virus Ab: NONREACTIVE

## 2023-11-06 LAB — RPR: RPR Ser Ql: NONREACTIVE

## 2023-11-06 LAB — HIV ANTIBODY (ROUTINE TESTING W REFLEX): HIV Screen 4th Generation wRfx: NONREACTIVE

## 2023-11-08 LAB — CERVICOVAGINAL ANCILLARY ONLY
Chlamydia: NEGATIVE
Comment: NEGATIVE
Comment: NEGATIVE
Comment: NORMAL
Neisseria Gonorrhea: NEGATIVE
Trichomonas: NEGATIVE

## 2023-11-11 ENCOUNTER — Encounter: Payer: Self-pay | Admitting: Orthopedic Surgery

## 2023-11-11 NOTE — Telephone Encounter (Signed)
Cervical xrays dated 10/22/23:  FINDINGS: Preserved vertebral body height, bone mineralization and prevertebral soft tissues. Mild disc height loss at C5-6 and C6-7 with endplate osteophytes. Trace at C4-5 with flexion and extension there is no evidence of active subluxation. There is trace retrolisthesis on both flexion and extension at C5-6 and C6-7.   IMPRESSION: Moderate midcervical spine degenerative changes. Trace retrolisthesis seen at C5-6 and C6-7 but no change with flexion and extension. No evidence of active subluxation.     Electronically Signed   By: Karen Kays M.D.   On: 11/09/2023 16:23   Lumbar xrays dated 10/22/23:  FINDINGS: Five lumbar-type vertebral bodies. Slight levoconvex curvature of the spine centered at the L2-3 level. Preserved vertebral body heights. Mild disc height loss at L2-3 and L3-4. Lower lumbar facet degenerative changes. With flexion there is trace anterolisthesis at L3-4 with minimal improvement with the extension.   IMPRESSION: Mild degenerative changes. Trace anterolisthesis at L3-4 and flexion slightly improves with the extension.     Electronically Signed   By: Karen Kays M.D.   On: 11/09/2023 16:26  I have personally reviewed the images and agree with the above interpretation.   Message sent to patient regarding xray results. Will f/u prn as she is not hurting.

## 2023-11-12 ENCOUNTER — Encounter: Payer: Self-pay | Admitting: Infectious Diseases

## 2023-11-12 ENCOUNTER — Other Ambulatory Visit
Admission: RE | Admit: 2023-11-12 | Discharge: 2023-11-12 | Disposition: A | Discharge status: Home / Self Care | Payer: 59 | Source: Ambulatory Visit | Attending: Infectious Diseases | Admitting: Infectious Diseases

## 2023-11-12 ENCOUNTER — Ambulatory Visit: Payer: 59 | Attending: Infectious Diseases | Admitting: Infectious Diseases

## 2023-11-12 VITALS — BP 123/85 | HR 94 | Temp 97.4°F | Ht 64.0 in | Wt 170.0 lb

## 2023-11-12 DIAGNOSIS — Z7901 Long term (current) use of anticoagulants: Secondary | ICD-10-CM | POA: Insufficient documentation

## 2023-11-12 DIAGNOSIS — B9561 Methicillin susceptible Staphylococcus aureus infection as the cause of diseases classified elsewhere: Secondary | ICD-10-CM | POA: Diagnosis not present

## 2023-11-12 DIAGNOSIS — I10 Essential (primary) hypertension: Secondary | ICD-10-CM | POA: Diagnosis not present

## 2023-11-12 DIAGNOSIS — M00061 Staphylococcal arthritis, right knee: Secondary | ICD-10-CM

## 2023-11-12 DIAGNOSIS — Z86718 Personal history of other venous thrombosis and embolism: Secondary | ICD-10-CM | POA: Diagnosis not present

## 2023-11-12 DIAGNOSIS — M00861 Arthritis due to other bacteria, right knee: Secondary | ICD-10-CM | POA: Insufficient documentation

## 2023-11-12 DIAGNOSIS — E785 Hyperlipidemia, unspecified: Secondary | ICD-10-CM | POA: Insufficient documentation

## 2023-11-12 LAB — COMPREHENSIVE METABOLIC PANEL
ALT: 17 U/L (ref 0–44)
AST: 23 U/L (ref 15–41)
Albumin: 3.4 g/dL — ABNORMAL LOW (ref 3.5–5.0)
Alkaline Phosphatase: 45 U/L (ref 38–126)
Anion gap: 11 (ref 5–15)
BUN: 19 mg/dL (ref 8–23)
CO2: 22 mmol/L (ref 22–32)
Calcium: 9.8 mg/dL (ref 8.9–10.3)
Chloride: 103 mmol/L (ref 98–111)
Creatinine, Ser: 0.99 mg/dL (ref 0.44–1.00)
GFR, Estimated: >60 mL/min (ref >60)
Glucose, Bld: 98 mg/dL (ref 70–99)
Potassium: 3.8 mmol/L (ref 3.5–5.1)
Sodium: 136 mmol/L (ref 135–145)
Total Bilirubin: 0.3 mg/dL (ref <1.2)
Total Protein: 7.2 g/dL (ref 6.5–8.1)

## 2023-11-12 LAB — CBC WITH DIFFERENTIAL/PLATELET
Abs Immature Granulocytes: 0.04 10*3/uL (ref 0.00–0.07)
Basophils Absolute: 0.1 10*3/uL (ref 0.0–0.1)
Basophils Relative: 1 %
Eosinophils Absolute: 0.6 10*3/uL — ABNORMAL HIGH (ref 0.0–0.5)
Eosinophils Relative: 5 %
HCT: 36.6 % (ref 36.0–46.0)
Hemoglobin: 12.1 g/dL (ref 12.0–15.0)
Immature Granulocytes: 0 %
Lymphocytes Relative: 20 %
Lymphs Abs: 2.4 10*3/uL (ref 0.7–4.0)
MCH: 29.2 pg (ref 26.0–34.0)
MCHC: 33.1 g/dL (ref 30.0–36.0)
MCV: 88.4 fL (ref 80.0–100.0)
Monocytes Absolute: 0.8 10*3/uL (ref 0.1–1.0)
Monocytes Relative: 6 %
Neutro Abs: 8.4 10*3/uL — ABNORMAL HIGH (ref 1.7–7.7)
Neutrophils Relative %: 68 %
Platelets: 483 10*3/uL — ABNORMAL HIGH (ref 150–400)
RBC: 4.14 MIL/uL (ref 3.87–5.11)
RDW: 14.2 % (ref 11.5–15.5)
WBC: 12.2 10*3/uL — ABNORMAL HIGH (ref 4.0–10.5)
nRBC: 0 % (ref 0.0–0.2)

## 2023-11-12 LAB — SEDIMENTATION RATE: Sed Rate: 73 mm/h — ABNORMAL HIGH (ref 0–22)

## 2023-11-12 LAB — C-REACTIVE PROTEIN: CRP: 0.6 mg/dL (ref <1.0)

## 2023-11-12 NOTE — Patient Instructions (Signed)
Will do labs today- continue keflex for 2 weeks- will decide on ea;ry termination or prolongation depending on the results

## 2023-11-12 NOTE — Progress Notes (Signed)
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 Started colchicine in Nov and she takes it PRN- her last dose wa son Dec 4th and the swelling reoslved and she had diarrhea- this was more for the ankl swelling and pain rather than the knee. Colchicine was given by her PCP for pseudogout   Following taken from last visit note     Patricia Callahan is a 62 y.o. 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 As per patient a few days prior to that she had procedure done on the chest lesion by derm  On 10/3 pt underwent arthroscopic debridement by DrBowers. Cell count was 1776- positive for cppd crystals Culture was negative She continued Doxy As the knee continued to be swollen along with worsening leg swelling she had another aspiration on 10/18 and culture was negative. She had ultrasound that day and it showed popliteal and calf vein DVT. She was referred to ED and was starte don eliquis and sent home from ED on 10/19. She was started on Keflex on 10/16 by Dr.Bowers Pt says her knee pain is much better and is able to bend to knee upto 90deg Her ankle and leg swollen  Past Medical History:  Diagnosis Date   Crohn's disease (HCC)    External hemorrhoid    GERD (gastroesophageal reflux disease)    OCC TUMS PRN   Hypercholesteremia     Hypertension    Plantar fasciitis of left foot    Vasomotor symptoms due to menopause     Past Surgical History:  Procedure Laterality Date   COLON SURGERY     COLONOSCOPY  2015   normal, was advised to return in 10 years   COLONOSCOPY N/A 12/05/2022   Procedure: COLONOSCOPY;  Surgeon: Toledo, Boykin Nearing, MD;  Location: [facility] ENDOSCOPY;  Service: Gastroenterology;  Laterality: N/A;   COLONOSCOPY WITH PROPOFOL N/A 09/10/2019   Procedure: COLONOSCOPY WITH PROPOFOL;  Surgeon: Wyline Mood, MD;  Location: [facility] ENDOSCOPY;  Service: Gastroenterology;  Laterality: N/A;   COLOSTOMY REVISION  10/08/2019   Procedure: COLON RESECTION RIGHT;  Surgeon: Sung Amabile, DO;  Location: [facility] ORS;  Service: General;;   KNEE SURGERY Right 09/05/2023   LAPAROSCOPIC RIGHT COLECTOMY Right 10/08/2019   Procedure: ATTEMPTED LAPAROSCOPIC RIGHT COLECTOMY;  Surgeon: Sung Amabile, DO;  Location: [facility] ORS;  Service: General;  Laterality: Right;   PLANTAR FASCIA RELEASE Left 04/28/2020   Procedure: ENDOSCOPIC PLANTAR FASCIA RELEASE WITH TOPAZ LEFT;  Surgeon: Recardo Evangelist, DPM;  Location[facility];  Service: Podiatry;  Laterality: Left;  LMA WITH POP BLOCK    Social History   Socioeconomic History   Marital status: Widowed    Spouse name: Not on file   Number of children: 0  Years of education: 69   Highest education level: Not on file  Occupational History   Not on file  Tobacco Use   Smoking status: Never   Smokeless tobacco: Never  Vaping Use   Vaping status: Never Used  Substance and Sexual Activity   Alcohol use: Yes    Comment: SOCIAL   Drug use: No   Sexual activity: Yes    Birth control/protection: Post-menopausal  Other Topics Concern   Not on file  Social History Narrative   Not on file   Social Determinants of Health   Financial Resource Strain: Low Risk  (01/10/2023)   Received from [facility], [facility]   Overall Financial Resource  Strain (CARDIA)    Difficulty of Paying Living Expenses: Not very hard  Food Insecurity: No Food Insecurity (01/10/2023)   Received from [facility], [facility]   Hunger Vital Sign    Worried About Running Out of Food in the Last Year: Never true    Ran Out of Food in the Last Year: Never true  Transportation Needs: No Transportation Needs (01/10/2023)   Received from [facility], [facility]   [facility] - Transportation    In the past 12 months, has lack of transportation kept you from medical appointments or from getting medications?: No    Lack of Transportation (Non-Medical): No  Physical Activity: Inactive (03/28/2018)   Exercise Vital Sign    Days of Exercise per Week: 0 days    Minutes of Exercise per Session: 0 min  Stress: No Stress Concern Present (03/28/2018)   [employer] of Occupational Health - Occupational Stress Questionnaire    Feeling of Stress : Not at all  Social Connections: Not on file  Intimate Partner Violence: Not on file    Family History  Problem Relation Age of Onset   Diabetes Mother    Hypertension Mother    Hyperlipidemia Mother    Hypertension Father    Hyperlipidemia Father    Dementia Father    Prostate cancer Father    Heart disease Father    Breast cancer Neg Hx    No Known Allergies I? Current Outpatient Medications  Medication Sig Dispense Refill   apixaban (ELIQUIS) 5 MG TABS tablet Take 1 tablet (5 mg total) by mouth 2 (two) times daily. 60 tablet 5   Ascorbic Acid (VITAMIN C) 1000 MG tablet Take 1,000 mg by mouth daily.     calcium carbonate (TUMS - DOSED IN MG ELEMENTAL CALCIUM) 500 MG chewable tablet Chew 1 tablet by mouth as needed for indigestion or heartburn.     Cephalexin 500 MG tablet Take 2 tablets (1,000 mg total) by mouth 4 (four) times daily. 240 tablet 0   Cholecalciferol (VITAMIN D3) 50 MCG (2000 UT) TABS Take 2,000 Units by mouth daily.      colchicine 0.6 MG tablet Take by mouth.     fexofenadine (ALLEGRA) 180 MG tablet Take 180 mg by mouth daily.     HYDROcodone-acetaminophen (NORCO/VICODIN) 5-325 MG tablet Take 1 tablet by mouth every 6 (six) hours as needed. 15 tablet 0   meloxicam (MOBIC) 15 MG tablet Take 15 mg by mouth daily.     Multiple Vitamin (MULTIVITAMIN WITH MINERALS) TABS tablet Take 1 tablet by mouth daily.     norethindrone (AYGESTIN) 5 MG tablet Take 0.5 tablets (2.5 mg total) by mouth daily. 45 tablet 0   nystatin (MYCOSTATIN/NYSTOP) powder Apply  topically daily.     Probiotic Product (DIGESTIVE ADVANTAGE GUMMIES) CHEW Chew by mouth.     simvastatin (ZOCOR) 20 MG tablet Take 20 mg by mouth at bedtime.      traMADol (ULTRAM) 50 MG tablet 1-2 po q6 prn pain     vitamin B-12 (CYANOCOBALAMIN) 1000 MCG tablet Take 1,000 mcg by mouth daily.     metoprolol succinate (TOPROL-XL) 25 MG 24 hr tablet Take by mouth.     No current facility-administered medications for this visit.     Abtx:  Anti-infectives (From admission, onward)    None       REVIEW OF SYSTEMS:  Const: negative fever, negative chills, negative weight loss Eyes: negative diplopia or visual changes, negative eye pain ENT: negative coryza, negative sore throat Resp: negative cough, hemoptysis, dyspnea Cards: negative for chest pain, palpitations, lower extremity edema GU: negative for frequency, dysuria and hematuria GI: Negative for abdominal pain, diarrhea, bleeding, constipation Skin: negative for rash and pruritus Heme: negative for easy bruising and gum/nose bleeding MS: rt leg pain and swelling much better She was ambulatory today , not in wheel chair Neurolo:negative for headaches, dizziness, vertigo, memory problems  Psych: negative for feelings of anxiety, depression  Endocrine: negative for thyroid, diabetes Allergy/Immunology- negative for any medication or food allergies  Objective:  VITALS:  BP 123/85   Pulse 94   Temp (!)  97.4 F (36.3 C) (Temporal)   Ht 5\' 4"  (1.626 m)   Wt 170 lb (77.1 kg)   BMI 29.18 kg/m   PHYSICAL EXAM:  General: Alert, cooperative, no distress, appears stated age.  Head: Normocephalic, without obvious abnormality, atraumatic. Eyes: Conjunctivae clear, anicteric sclerae. Pupils are equal ENT Nares normal. No drainage or sinus tenderness. Lips, mucosa, and tongue normal. No Thrush Neck: Supple, symmetrical, no adenopathy, thyroid: non tender no carotid bruit and no JVD. Back: No CVA tenderness. Lungs: Clear to auscultation bilaterally. No Wheezing or Rhonchi. No rales. Heart: Regular rate and rhythm, no murmur, rub or gallop. Skin: No rashes or lesions. Or bruising Lymph: Cervical, supraclavicular normal. Neurologic: Grossly non-focal Pertinent Labs None recently  ESR 111/5/24 - 117 CRP 4.7   Microbiology: 9/30 synovial fluid- MSSA 10/3 culture neg 10/18 culture neg  ? Impression/Recommendation ?Rt Knee septic arthritis due to MSSA- s/p aspiration knee 9/30 and , washout and repeat aspiration. On 10/3 pt underwent arthroscopic debridement by DrBowers. Cell count was 1776- positive for cppd crystals. another aspiration on 10/18 and culture was negative.  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 Continue keflex for 2 more weeks Note:  This document was prepared using Dragon voice recognition software and may include  unintentional dictation errors.

## 2023-11-19 ENCOUNTER — Other Ambulatory Visit: Payer: Self-pay | Admitting: Obstetrics and Gynecology

## 2023-11-19 ENCOUNTER — Other Ambulatory Visit: Payer: Self-pay | Admitting: Infectious Diseases

## 2023-11-19 DIAGNOSIS — N951 Menopausal and female climacteric states: Secondary | ICD-10-CM

## 2023-11-19 DIAGNOSIS — Z79890 Hormone replacement therapy (postmenopausal): Secondary | ICD-10-CM

## 2023-12-11 DIAGNOSIS — M1120 Other chondrocalcinosis, unspecified site: Secondary | ICD-10-CM | POA: Diagnosis not present

## 2023-12-11 DIAGNOSIS — M00061 Staphylococcal arthritis, right knee: Secondary | ICD-10-CM | POA: Diagnosis not present

## 2023-12-12 ENCOUNTER — Other Ambulatory Visit: Payer: Self-pay | Admitting: Infectious Diseases

## 2023-12-12 DIAGNOSIS — R197 Diarrhea, unspecified: Secondary | ICD-10-CM

## 2023-12-12 NOTE — Progress Notes (Signed)
 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 She has 3 small loose stool a day, lot of gas- will check cdiff- and blood test . She will come on Monday to get the labs

## 2023-12-16 ENCOUNTER — Other Ambulatory Visit
Admission: RE | Admit: 2023-12-16 | Discharge: 2023-12-16 | Disposition: A | Discharge status: Home / Self Care | Payer: 59 | Source: Ambulatory Visit | Attending: Infectious Diseases | Admitting: Infectious Diseases

## 2023-12-16 DIAGNOSIS — R197 Diarrhea, unspecified: Secondary | ICD-10-CM | POA: Insufficient documentation

## 2023-12-16 LAB — BASIC METABOLIC PANEL
Anion gap: 12 (ref 5–15)
BUN: 16 mg/dL (ref 8–23)
CO2: 22 mmol/L (ref 22–32)
Calcium: 9.5 mg/dL (ref 8.9–10.3)
Chloride: 107 mmol/L (ref 98–111)
Creatinine, Ser: 1.05 mg/dL — ABNORMAL HIGH (ref 0.44–1.00)
GFR, Estimated: >60 mL/min (ref >60)
Glucose, Bld: 104 mg/dL — ABNORMAL HIGH (ref 70–99)
Potassium: 3.8 mmol/L (ref 3.5–5.1)
Sodium: 141 mmol/L (ref 135–145)

## 2023-12-16 LAB — CBC WITH DIFFERENTIAL/PLATELET
Abs Immature Granulocytes: 0.03 10*3/uL (ref 0.00–0.07)
Basophils Absolute: 0.1 10*3/uL (ref 0.0–0.1)
Basophils Relative: 1 %
Eosinophils Absolute: 0.2 10*3/uL (ref 0.0–0.5)
Eosinophils Relative: 2 %
HCT: 36.8 % (ref 36.0–46.0)
Hemoglobin: 11.9 g/dL — ABNORMAL LOW (ref 12.0–15.0)
Immature Granulocytes: 0 %
Lymphocytes Relative: 26 %
Lymphs Abs: 2.6 10*3/uL (ref 0.7–4.0)
MCH: 28.5 pg (ref 26.0–34.0)
MCHC: 32.3 g/dL (ref 30.0–36.0)
MCV: 88.2 fL (ref 80.0–100.0)
Monocytes Absolute: 0.7 10*3/uL (ref 0.1–1.0)
Monocytes Relative: 7 %
Neutro Abs: 6.4 10*3/uL (ref 1.7–7.7)
Neutrophils Relative %: 64 %
Platelets: 500 10*3/uL — ABNORMAL HIGH (ref 150–400)
RBC: 4.17 MIL/uL (ref 3.87–5.11)
RDW: 14.4 % (ref 11.5–15.5)
WBC: 10 10*3/uL (ref 4.0–10.5)
nRBC: 0 % (ref 0.0–0.2)

## 2023-12-16 LAB — C-REACTIVE PROTEIN: CRP: 2.4 mg/dL — ABNORMAL HIGH (ref <1.0)

## 2023-12-16 LAB — SEDIMENTATION RATE: Sed Rate: 81 mm/h — ABNORMAL HIGH (ref 0–30)

## 2023-12-17 ENCOUNTER — Telehealth: Payer: Self-pay | Admitting: Infectious Diseases

## 2023-12-17 ENCOUNTER — Other Ambulatory Visit: Payer: Self-pay

## 2023-12-17 ENCOUNTER — Other Ambulatory Visit
Admission: RE | Admit: 2023-12-17 | Discharge: 2023-12-17 | Disposition: A | Discharge status: Home / Self Care | Payer: 59 | Attending: Infectious Diseases | Admitting: Infectious Diseases

## 2023-12-17 ENCOUNTER — Other Ambulatory Visit: Payer: Self-pay | Admitting: Infectious Diseases

## 2023-12-17 ENCOUNTER — Other Ambulatory Visit (HOSPITAL_COMMUNITY): Payer: Self-pay

## 2023-12-17 ENCOUNTER — Telehealth: Payer: Self-pay

## 2023-12-17 DIAGNOSIS — R197 Diarrhea, unspecified: Secondary | ICD-10-CM

## 2023-12-17 LAB — C DIFFICILE QUICK SCREEN W PCR REFLEX
C Diff antigen: POSITIVE — AB
C Diff interpretation: DETECTED
C Diff toxin: POSITIVE — AB

## 2023-12-17 MED ORDER — DIFICID 200 MG PO TABS: 200.0000 mg | ORAL_TABLET | Freq: Two times a day (BID) | ORAL | 0 refills | Status: DC

## 2023-12-17 MED ORDER — DIFICID 200 MG PO TABS
200.0000 mg | ORAL_TABLET | Freq: Two times a day (BID) | ORAL | 0 refills | Status: AC
  Filled 2023-12-17 – 2023-12-25 (×3): qty 20, 10d supply, fill #0

## 2023-12-17 NOTE — Progress Notes (Signed)
 Cdiff positive  Cfidd diarrhea Sent fidoxamicin 200mg PO BID x 10 days to [facility]

## 2023-12-17 NOTE — Telephone Encounter (Signed)
 Per Dr. Fayette Cdiff positive  Cfidd diarrhea [facility]    [facility] of [facility].  Medication resent to [facility] for the patient and will be mailed to her.  PA for medication also approved and [facility] is aware.  Patient aware of results and medication being mailed to her from [facility] and there will be a $15.00 copay. Patricia Callahan, CMA

## 2023-12-17 NOTE — Telephone Encounter (Signed)
 Cdiff positive in stool today She had completed keflex/ 3 weeks ago Her diarrhea has improved but still present Sent prescription for fidoxamicin 200mg  BID to  [facility] I called her and sh said [facility] is mailing her the medication

## 2023-12-17 NOTE — Telephone Encounter (Signed)
 Received critical value for patient from [facility] with [facility] microbiolgy lab.  Cdiff test for the patient positive. I have sent Dr. Rivka Safer a secure msg with results.  Dontrell Stuck Jonathon Resides, CMA

## 2023-12-19 ENCOUNTER — Other Ambulatory Visit (HOSPITAL_COMMUNITY): Payer: Self-pay

## 2023-12-19 ENCOUNTER — Other Ambulatory Visit: Payer: Self-pay

## 2023-12-20 ENCOUNTER — Other Ambulatory Visit: Payer: Self-pay

## 2023-12-25 ENCOUNTER — Other Ambulatory Visit: Payer: Self-pay

## 2023-12-25 ENCOUNTER — Other Ambulatory Visit (HOSPITAL_COMMUNITY): Payer: Self-pay

## 2024-01-02 ENCOUNTER — Other Ambulatory Visit: Payer: Self-pay | Admitting: Obstetrics and Gynecology

## 2024-01-02 DIAGNOSIS — Z79890 Hormone replacement therapy (postmenopausal): Secondary | ICD-10-CM

## 2024-01-02 DIAGNOSIS — N951 Menopausal and female climacteric states: Secondary | ICD-10-CM

## 2024-01-06 ENCOUNTER — Telehealth: Payer: Self-pay

## 2024-01-06 DIAGNOSIS — Z0000 Encounter for general adult medical examination without abnormal findings: Secondary | ICD-10-CM | POA: Diagnosis not present

## 2024-01-06 NOTE — Telephone Encounter (Signed)
Patient left vm stating she has completed antibiotics and would like to know if she needs to come in for repeat testing. Left vm requesting call back. Will need to ask pt when she finished antibiotics, if she has any symptoms.  Juanita Laster, RMA

## 2024-01-07 DIAGNOSIS — Z0000 Encounter for general adult medical examination without abnormal findings: Secondary | ICD-10-CM | POA: Diagnosis not present

## 2024-01-07 NOTE — Telephone Encounter (Signed)
 I spoke to the patient she reports no longer having diarrhea and completed the Dificid .  Patient advised that she does not need to be retested for Cdiff if no longer having diarrhea per Dr. Fayette.  Dr. Fayette has also reassured the patient on the phone that she does not need to be retested.  She also advised patient that she should let providers know that she has had Cdiff if she is needing any abx treatment.  Kawanda Drumheller ONEIDA Ligas, CMA

## 2024-01-09 ENCOUNTER — Encounter: Payer: Self-pay | Admitting: Obstetrics and Gynecology

## 2024-01-09 NOTE — Telephone Encounter (Signed)
**Note De-identified  [place] Obfuscation** Please advise 

## 2024-01-16 DIAGNOSIS — D72829 Elevated white blood cell count, unspecified: Secondary | ICD-10-CM | POA: Diagnosis not present

## 2024-01-16 DIAGNOSIS — I82401 Acute embolism and thrombosis of unspecified deep veins of right lower extremity: Secondary | ICD-10-CM | POA: Diagnosis not present

## 2024-01-16 DIAGNOSIS — I10 Essential (primary) hypertension: Secondary | ICD-10-CM | POA: Diagnosis not present

## 2024-01-16 DIAGNOSIS — E7800 Pure hypercholesterolemia, unspecified: Secondary | ICD-10-CM | POA: Diagnosis not present

## 2024-01-16 DIAGNOSIS — M1120 Other chondrocalcinosis, unspecified site: Secondary | ICD-10-CM | POA: Diagnosis not present

## 2024-01-16 DIAGNOSIS — Z0000 Encounter for general adult medical examination without abnormal findings: Secondary | ICD-10-CM | POA: Diagnosis not present

## 2024-01-16 DIAGNOSIS — M25461 Effusion, right knee: Secondary | ICD-10-CM | POA: Diagnosis not present

## 2024-02-01 ENCOUNTER — Other Ambulatory Visit: Payer: Self-pay | Admitting: Obstetrics and Gynecology

## 2024-02-01 DIAGNOSIS — N951 Menopausal and female climacteric states: Secondary | ICD-10-CM

## 2024-02-01 DIAGNOSIS — Z79890 Hormone replacement therapy (postmenopausal): Secondary | ICD-10-CM

## 2024-03-07 ENCOUNTER — Other Ambulatory Visit: Payer: Self-pay | Admitting: Obstetrics and Gynecology

## 2024-03-07 DIAGNOSIS — N951 Menopausal and female climacteric states: Secondary | ICD-10-CM

## 2024-03-07 DIAGNOSIS — Z79890 Hormone replacement therapy (postmenopausal): Secondary | ICD-10-CM

## 2024-03-08 NOTE — Progress Notes (Unsigned)
 MRN : [id number]  Patricia Callahan is a 63 y.o. (29-Jun-1961) female who presents with chief complaint of legs hurt and swell.  History of Present Illness:   The patient presents to the office for evaluation of DVT.  DVT was identified at [facility] by Duplex ultrasound dated 09/20/2023.     The initial symptoms were pain and swelling in the right lower extremity.  Subsequently it was determined that she actually had a septic knee joint.  This inflammation is likely the cause of the clot.  This was her first clot ever.   The patient notes the knee of the affected leg is painful with dependency and swells.  This is centered on the knee joint itself and essentially all her other presenting symptoms are completely resolved   No SOB or pleuritic chest pains.  No cough or hemoptysis.   No blood per rectum or blood in any sputum.  No excessive bruising per the patient.    The patient denies rest pain or dangling of an extremity off the side of the bed during the night for relief. No open wounds or sores at this time.   The patient denies amaurosis fugax or recent TIA symptoms. There are no recent neurological changes noted. No recent episodes of angina or shortness of breath documented.    Duplex ultrasound of the lower extremity obtained today demonstrates pleat and total resolution of the previous DVT.  All veins are fully compressible.  There is no evidence for residual thrombus or chronic changes.   No outpatient medications have been marked as taking for the 03/09/24 encounter (Appointment) with Gilda Crease, Latina Craver, MD.    Past Medical History:  Diagnosis Date   Crohn's disease [facility])    External hemorrhoid    GERD (gastroesophageal reflux disease)    OCC TUMS PRN   Hypercholesteremia    Hypertension    Plantar fasciitis of left foot    Vasomotor symptoms due to menopause     Past Surgical History:  Procedure Laterality Date   COLON SURGERY     COLONOSCOPY  2015   normal, was  advised to return in 10 years   COLONOSCOPY N/A 12/05/2022   Procedure: COLONOSCOPY;  Surgeon: Toledo, Boykin Nearing, MD;  Location: [facility] ENDOSCOPY;  Service: Gastroenterology;  Laterality: N/A;   COLONOSCOPY WITH PROPOFOL N/A 09/10/2019   Procedure: COLONOSCOPY WITH PROPOFOL;  Surgeon: Wyline Mood, MD;  Location: [facility] ENDOSCOPY;  Service: Gastroenterology;  Laterality: N/A;   COLOSTOMY REVISION  10/08/2019   Procedure: COLON RESECTION RIGHT;  Surgeon: Sung Amabile, DO;  Location: [facility] ORS;  Service: General;;   KNEE SURGERY Right 09/05/2023   LAPAROSCOPIC RIGHT COLECTOMY Right 10/08/2019   Procedure: ATTEMPTED LAPAROSCOPIC RIGHT COLECTOMY;  Surgeon: Sung Amabile, DO;  Location: [facility] ORS;  Service: General;  Laterality: Right;   PLANTAR FASCIA RELEASE Left 04/28/2020   Procedure: ENDOSCOPIC PLANTAR FASCIA RELEASE WITH TOPAZ LEFT;  Surgeon: Recardo Evangelist, DPM;  Location: [facility];  Service: Podiatry;  Laterality: Left;  LMA WITH POP BLOCK    Social History Social History   Tobacco Use   Smoking status: Never   Smokeless tobacco: Never  Vaping Use   Vaping status: Never Used  Substance Use Topics   Alcohol use: Yes    Comment: SOCIAL   Drug use: No    Family History Family History  Problem Relation Age of Onset   Diabetes Mother    Hypertension Mother  Hyperlipidemia Mother    Hypertension Father    Hyperlipidemia Father    Dementia Father    Prostate cancer Father    Heart disease Father    Breast cancer Neg Hx     No Known Allergies   REVIEW OF SYSTEMS (Negative unless checked)  Constitutional: [] Weight loss  [] Fever  [] Chills Cardiac: [] Chest pain   [] Chest pressure   [] Palpitations   [] Shortness of breath when laying flat   [] Shortness of breath with exertion. Vascular:  [] Pain in legs with walking   [x] Pain in legs at rest  [] History of DVT   [] Phlebitis   [x] Swelling in legs   [] Varicose veins   [] Non-healing ulcers Pulmonary:   [] Uses home oxygen    [] Productive cough   [] Hemoptysis   [] Wheeze  [] COPD   [] Asthma Neurologic:  [] Dizziness   [] Seizures   [] History of stroke   [] History of TIA  [] Aphasia   [] Vissual changes   [] Weakness or numbness in arm   [] Weakness or numbness in leg Musculoskeletal:   [] Joint swelling   [] Joint pain   [] Low back pain Hematologic:  [] Easy bruising  [] Easy bleeding   [] Hypercoagulable state   [] Anemic Gastrointestinal:  [] Diarrhea   [] Vomiting  [] Gastroesophageal reflux/heartburn   [] Difficulty swallowing. Genitourinary:  [] Chronic kidney disease   [] Difficult urination  [] Frequent urination   [] Blood in urine Skin:  [] Rashes   [] Ulcers  Psychological:  [] History of anxiety   []  History of major depression.  Physical Examination  There were no vitals filed for this visit. There is no height or weight on file to calculate BMI. Gen: WD/WN, NAD Head: [place]/AT, No temporalis wasting.  Ear/Nose/Throat: Hearing grossly intact, nares w/o erythema or drainage, pinna without lesions Eyes: PER, EOMI, sclera nonicteric.  Neck: Supple, no gross masses.  No JVD.  Pulmonary:  Good air movement, no audible wheezing, no use of accessory muscles.  Cardiac: RRR, precordium not hyperdynamic. Vascular: Minimal changes of the right lower extremity with trace to 1+ edema  Radial Palpable Palpable  Gastrointestinal: soft, non-distended. No guarding/no peritoneal signs.  Musculoskeletal: M/S 5/5 throughout.  No deformity.  Neurologic: CN 2-12 intact. Pain and light touch intact in extremities.  Symmetrical.  Speech is fluent. Motor exam as listed above. Psychiatric: Judgment intact, Mood & affect appropriate for pt's clinical situation. Dermatologic: Venous rashes no ulcers noted.  No changes consistent with cellulitis. Lymph : No lichenification or skin changes of chronic lymphedema.  CBC Lab Results  Component Value Date   WBC 10.0 12/16/2023   HGB 11.9 (L) 12/16/2023   HCT 36.8 12/16/2023   MCV 88.2 12/16/2023   PLT  500 (H) 12/16/2023    BMET    Component Value Date/Time   NA 141 12/16/2023 1354   NA 142 10/22/2019 1610   K 3.8 12/16/2023 1354   CL 107 12/16/2023 1354   CO2 22 12/16/2023 1354   GLUCOSE 104 (H) 12/16/2023 1354   BUN 16 12/16/2023 1354   BUN 15 10/22/2019 1610   CREATININE 1.05 (H) 12/16/2023 1354   CALCIUM 9.5 12/16/2023 1354   GFRNONAA >60 12/16/2023 1354   GFRAA 84 10/22/2019 1610   CrCl cannot be calculated (Patient's most recent lab result is older than the maximum 21 days allowed.).  COAG Lab Results  Component Value Date   INR 1.1 09/20/2023    Radiology No results found.   Assessment/Plan 1. Deep vein thrombosis (DVT) of lower extremity, unspecified chronicity, unspecified laterality, unspecified vein (HCC) Recommend:  No surgery or intervention at this point in time.  IVC filter is not indicated at present.  Duplex ultrasound of the lower extremity obtained today demonstrates pleat and total resolution of the previous DVT.  All veins are fully compressible.  There is no evidence for residual thrombus or chronic changes.  Patient is cleared for all orthopedic procedures regarding her right knee as there is no residual thrombus and she has completed her anticoagulation.  The patient has completed 6 months anticoagulation   Elevation was stressed, such as the use of a recliner.  I have reviewed with the patient DVT and post phlebitic changes such as swelling and why it  causes symptoms such as pain.  I recommended to the patient to wear graduated compression stockings, beginning after three full days of anticoagulation.  Graduated compression should be worn on a daily basis. The patient should wear compression beginning first thing in the morning and removing them in the evening. The patient is instructed specifically not to sleep in the stockings.  In addition, behavioral modification including elevation during the day and avoidance of prolonged dependency  will be initiated.    The patient will stop anticoagulation for now as there have not been any problems or complications at this point.  I did discuss half dose therapy, however at this time the patient wishes to just stop anticoagulation.  2. Chronic venous insufficiency (Primary) See 1  3. Lymphedema Graduated compression and is recommended  4. Hypercholesterolemia Continue statin as ordered and reviewed, no changes at this time  5. Essential hypertension Continue antihypertensive medications as already ordered, these medications have been reviewed and there are no changes at this time.    Levora Dredge, MD  03/08/2024 4:17 PM

## 2024-03-09 ENCOUNTER — Ambulatory Visit (INDEPENDENT_AMBULATORY_CARE_PROVIDER_SITE_OTHER): Payer: 59

## 2024-03-09 ENCOUNTER — Ambulatory Visit (INDEPENDENT_AMBULATORY_CARE_PROVIDER_SITE_OTHER): Payer: 59 | Admitting: Vascular Surgery

## 2024-03-09 ENCOUNTER — Encounter (INDEPENDENT_AMBULATORY_CARE_PROVIDER_SITE_OTHER): Payer: Self-pay | Admitting: Vascular Surgery

## 2024-03-09 VITALS — BP 161/84 | HR 86 | Resp 86 | Wt 172.5 lb

## 2024-03-09 DIAGNOSIS — I82409 Acute embolism and thrombosis of unspecified deep veins of unspecified lower extremity: Secondary | ICD-10-CM

## 2024-03-09 DIAGNOSIS — I10 Essential (primary) hypertension: Secondary | ICD-10-CM

## 2024-03-09 DIAGNOSIS — E7800 Pure hypercholesterolemia, unspecified: Secondary | ICD-10-CM | POA: Diagnosis not present

## 2024-03-09 DIAGNOSIS — I890 Lymphedema, not elsewhere classified: Secondary | ICD-10-CM | POA: Diagnosis not present

## 2024-03-09 DIAGNOSIS — I82431 Acute embolism and thrombosis of right popliteal vein: Secondary | ICD-10-CM

## 2024-03-09 DIAGNOSIS — M4726 Other spondylosis with radiculopathy, lumbar region: Secondary | ICD-10-CM

## 2024-03-09 DIAGNOSIS — I872 Venous insufficiency (chronic) (peripheral): Secondary | ICD-10-CM

## 2024-03-11 DIAGNOSIS — M1120 Other chondrocalcinosis, unspecified site: Secondary | ICD-10-CM | POA: Diagnosis not present

## 2024-03-13 DIAGNOSIS — M1711 Unilateral primary osteoarthritis, right knee: Secondary | ICD-10-CM | POA: Diagnosis not present

## 2024-03-19 DIAGNOSIS — M6281 Muscle weakness (generalized): Secondary | ICD-10-CM | POA: Diagnosis not present

## 2024-03-30 DIAGNOSIS — M6281 Muscle weakness (generalized): Secondary | ICD-10-CM | POA: Diagnosis not present

## 2024-04-21 ENCOUNTER — Encounter (INDEPENDENT_AMBULATORY_CARE_PROVIDER_SITE_OTHER): Payer: Self-pay

## 2024-05-18 ENCOUNTER — Encounter: Payer: Self-pay | Admitting: Obstetrics and Gynecology

## 2024-06-10 ENCOUNTER — Other Ambulatory Visit: Payer: Self-pay | Admitting: Obstetrics and Gynecology

## 2024-06-10 DIAGNOSIS — N951 Menopausal and female climacteric states: Secondary | ICD-10-CM

## 2024-06-10 MED ORDER — VEOZAH 45 MG PO TABS: 45.0000 mg | ORAL_TABLET | Freq: Every day | ORAL | 2 refills | Status: AC

## 2024-06-10 NOTE — Progress Notes (Signed)
 Rx veozah  eRxd for VS sx. Chck CMP monthly for 3 months, then at 6 months.

## 2024-06-10 NOTE — Telephone Encounter (Signed)
 Samples up front, no coupon card. We do not have coupon cards in office.

## 2024-06-10 NOTE — Telephone Encounter (Signed)
 Pls f/u with pt re: 2 samples and coupon card. Thx.

## 2024-06-30 ENCOUNTER — Other Ambulatory Visit: Payer: Self-pay | Admitting: Obstetrics and Gynecology

## 2024-06-30 DIAGNOSIS — Z79890 Hormone replacement therapy (postmenopausal): Secondary | ICD-10-CM

## 2024-06-30 DIAGNOSIS — N951 Menopausal and female climacteric states: Secondary | ICD-10-CM

## 2024-07-01 NOTE — Telephone Encounter (Signed)
 Hi Alicia, We received a refill request for this patient's estradiol  and aygestin .  It looks like you discontinued her estradiol  in December and wanted her to start Aygestin , possibly weaning off after a few months.  Do you want to approve a refill for the Aygestin  at this time?

## 2024-08-14 ENCOUNTER — Encounter: Payer: Self-pay | Admitting: Internal Medicine

## 2024-08-28 ENCOUNTER — Other Ambulatory Visit: Payer: Self-pay | Admitting: Family Medicine

## 2024-08-28 DIAGNOSIS — Z1231 Encounter for screening mammogram for malignant neoplasm of breast: Secondary | ICD-10-CM

## 2024-09-21 ENCOUNTER — Ambulatory Visit
Admission: RE | Admit: 2024-09-21 | Discharge: 2024-09-21 | Disposition: A | Discharge status: Home / Self Care | Source: Ambulatory Visit | Attending: Family Medicine | Admitting: Family Medicine

## 2024-09-21 DIAGNOSIS — Z1231 Encounter for screening mammogram for malignant neoplasm of breast: Secondary | ICD-10-CM | POA: Diagnosis present

## 2024-10-20 ENCOUNTER — Ambulatory Visit
Admission: RE | Admit: 2024-10-20 | Discharge: 2024-10-20 | Disposition: A | Discharge status: Home / Self Care | Attending: Internal Medicine | Admitting: Internal Medicine

## 2024-10-20 ENCOUNTER — Encounter
Admission: RE | Disposition: A | Discharge status: Home / Self Care | Payer: Self-pay | Source: Home / Self Care | Attending: Internal Medicine

## 2024-10-20 ENCOUNTER — Ambulatory Visit

## 2024-10-20 ENCOUNTER — Encounter: Payer: Self-pay | Admitting: Internal Medicine

## 2024-10-20 DIAGNOSIS — K56699 Other intestinal obstruction unspecified as to partial versus complete obstruction: Secondary | ICD-10-CM | POA: Diagnosis not present

## 2024-10-20 DIAGNOSIS — K644 Residual hemorrhoidal skin tags: Secondary | ICD-10-CM | POA: Insufficient documentation

## 2024-10-20 DIAGNOSIS — M1990 Unspecified osteoarthritis, unspecified site: Secondary | ICD-10-CM | POA: Insufficient documentation

## 2024-10-20 DIAGNOSIS — K642 Third degree hemorrhoids: Secondary | ICD-10-CM | POA: Insufficient documentation

## 2024-10-20 DIAGNOSIS — K5190 Ulcerative colitis, unspecified, without complications: Secondary | ICD-10-CM | POA: Diagnosis present

## 2024-10-20 DIAGNOSIS — K219 Gastro-esophageal reflux disease without esophagitis: Secondary | ICD-10-CM | POA: Diagnosis not present

## 2024-10-20 DIAGNOSIS — I10 Essential (primary) hypertension: Secondary | ICD-10-CM | POA: Insufficient documentation

## 2024-10-20 HISTORY — PX: COLONOSCOPY: SHX5424

## 2024-10-20 SURGERY — COLONOSCOPY
Anesthesia: General

## 2024-10-20 MED ORDER — SODIUM CHLORIDE 0.9 % IV SOLN
INTRAVENOUS | Status: DC
  Administered 2024-10-20: 500 mL via INTRAVENOUS

## 2024-10-20 MED ORDER — PROPOFOL 10 MG/ML IV BOLUS
INTRAVENOUS | Status: AC
Start: 2024-10-20 — End: 2024-10-20
  Filled 2024-10-20: qty 20

## 2024-10-20 MED ORDER — PROPOFOL 1000 MG/100ML IV EMUL
INTRAVENOUS | Status: AC
  Filled 2024-10-20: qty 100

## 2024-10-20 MED ORDER — LIDOCAINE HCL (CARDIAC) PF 100 MG/5ML IV SOSY
PREFILLED_SYRINGE | INTRAVENOUS | Status: DC | PRN
  Administered 2024-10-20: 60 mg via INTRAVENOUS

## 2024-10-20 MED ORDER — PROPOFOL 10 MG/ML IV BOLUS
INTRAVENOUS | Status: DC | PRN
  Administered 2024-10-20: 30 mg via INTRAVENOUS
  Administered 2024-10-20: 40 mg via INTRAVENOUS
  Administered 2024-10-20: 30 mg via INTRAVENOUS
  Administered 2024-10-20 (×2): 40 mg via INTRAVENOUS
  Administered 2024-10-20: 70 mg via INTRAVENOUS
  Administered 2024-10-20: 30 mg via INTRAVENOUS
  Administered 2024-10-20: 40 mg via INTRAVENOUS

## 2024-10-20 NOTE — Transfer of Care (Signed)
 Immediate Anesthesia Transfer of Care Note  Patient: Patricia Callahan  Procedure(s) Performed: COLONOSCOPY  Patient Location: PACU  Anesthesia Type:General  Level of Consciousness: awake, alert , and oriented  Airway & Oxygen Therapy: Patient Spontanous Breathing  Post-op Assessment: Report given to RN and Post -op Vital signs reviewed and stable  Post vital signs: Reviewed and stable  Last Vitals:  Vitals Value Taken Time  BP 134/69 10/20/24 09:22  Temp    Pulse 77 10/20/24 09:22  Resp 14 10/20/24 09:22  SpO2 99 % 10/20/24 09:22    Last Pain:  Vitals:   10/20/24 0801  TempSrc: Temporal  PainSc: 0-No pain         Complications: No notable events documented.

## 2024-10-20 NOTE — H&P (Signed)
 Outpatient short stay form Pre-procedure 10/20/2024 8:51 AM Jessicamarie Amiri K. Aundria, M.D.  Primary Physician: Lynwood Null, M.D.  Reason for visit:  Hx colitis s/p ilieocecectomy w/ anastomosis - 2020.  History of present illness:    Colonoscopy 12/2021-internal hemorrhoids, grade 3 hemorrhoids, patent end-to-side ileocolonic anastomosis characterized by severe stenosis, dilated 15mm, normal appearing mucosa entire colon. Path with mild chronic inflammatory changes negative for active mucosal ileitis dysplasia or malignancy. 3-year repeat recommended.  History of ileitis-had colonoscopy with IC valve stricture 2020 and had ileocecectomy showing chronic active colitis with pathology consistent with Crohn's colitis. She initially was treated on 6-MP and Flagyl  but discontinued and has not been off all medications since 2021. She feels she is having fairly regular bowel movements daily as long as she takes a probiotic gummy and denies constipation or diarrhea. No rectal bleeding or melena. Denies abd pain.    Current Facility-Administered Medications:    0.9 %  sodium chloride  infusion, , Intravenous, Continuous, [place], Mikiah Demond K, MD, Last Rate: 20 mL/hr at 10/20/24 0830, Continued from Pre-op at 10/20/24 0830  Medications Prior to Admission  Medication Sig Dispense Refill Last Dose/Taking   amLODipine (NORVASC) 2.5 MG tablet Take 2.5 mg by mouth daily.   10/19/2024 Evening   Ascorbic Acid (VITAMIN C) 1000 MG tablet Take 1,000 mg by mouth daily.   Past Week   calcium carbonate (TUMS - DOSED IN MG ELEMENTAL CALCIUM) 500 MG chewable tablet Chew 1 tablet by mouth as needed for indigestion or heartburn.   Past Week   Cholecalciferol (VITAMIN D3) 50 MCG (2000 UT) TABS Take 2,000 Units by mouth daily.   Past Week   fexofenadine (ALLEGRA) 180 MG tablet Take 180 mg by mouth daily.   10/19/2024 Morning   meloxicam (MOBIC) 15 MG tablet Take 15 mg by mouth daily.   10/19/2024 Morning   Multiple Vitamin  (MULTIVITAMIN WITH MINERALS) TABS tablet Take 1 tablet by mouth daily.   Past Week   nystatin (MYCOSTATIN/NYSTOP) powder Apply topically daily.   Past Week   Probiotic Product (DIGESTIVE ADVANTAGE GUMMIES) CHEW Chew by mouth.   Past Week   simvastatin  (ZOCOR ) 20 MG tablet Take 20 mg by mouth at bedtime.    10/19/2024 Evening   traMADol (ULTRAM) 50 MG tablet 1-2 po q6 prn pain   Past Week   vitamin B-12 (CYANOCOBALAMIN) 1000 MCG tablet Take 1,000 mcg by mouth daily.   Past Week   apixaban  (ELIQUIS ) 5 MG TABS tablet Take 1 tablet (5 mg total) by mouth 2 (two) times daily. (Patient not taking: Reported on 10/20/2024) 60 tablet 5 Not Taking   Cephalexin  500 MG tablet Take 1 tablet (500 mg total) by mouth 4 (four) times daily. (Patient not taking: Reported on 03/09/2024) 56 tablet 0    colchicine 0.6 MG tablet Take by mouth. (Patient not taking: Reported on 10/20/2024)   Not Taking   Fezolinetant  (VEOZAH ) 45 MG TABS Take 1 tablet (45 mg total) by mouth daily. (Patient not taking: Reported on 10/20/2024) 30 tablet 2 Not Taking   fidaxomicin  (DIFICID ) 200 MG TABS tablet Take 1 tablet (200 mg total) by mouth 2 (two) times daily. 20 tablet 0    HYDROcodone -acetaminophen  (NORCO/VICODIN) 5-325 MG tablet Take 1 tablet by mouth every 6 (six) hours as needed. (Patient not taking: Reported on 03/09/2024) 15 tablet 0    metoprolol succinate (TOPROL-XL) 25 MG 24 hr tablet Take by mouth.      norethindrone  (AYGESTIN ) 5 MG tablet Take 0.5 tablets (  2.5 mg total) by mouth daily. 45 tablet 0      No Known Allergies   Past Medical History:  Diagnosis Date   Crohn's disease (HCC)    External hemorrhoid    GERD (gastroesophageal reflux disease)    OCC TUMS PRN   Hypercholesteremia    Hypertension    Plantar fasciitis of left foot    Vasomotor symptoms due to menopause     Review of systems:  Otherwise negative.    Physical Exam  Gen: Alert, oriented. Appears stated age.  HEENT: [place]/AT. PERRLA. Lungs: CTA,  no wheezes. CV: RR nl S1, S2. Abd: soft, benign, no masses. BS+ Ext: No edema. Pulses 2+    Planned procedures: Proceed with colonoscopy. The patient understands the nature of the planned procedure, indications, risks, alternatives and potential complications including but not limited to bleeding, infection, perforation, damage to internal organs and possible oversedation/side effects from anesthesia. The patient agrees and gives consent to proceed.  Please refer to procedure notes for findings, recommendations and patient disposition/instructions.     Ezell Poke K. Aundria, M.D. Gastroenterology 10/20/2024  8:51 AM

## 2024-10-20 NOTE — Anesthesia Postprocedure Evaluation (Signed)
 Anesthesia Post Note  Patient: Patricia Callahan  Procedure(s) Performed: COLONOSCOPY  Patient location during evaluation: PACU Anesthesia Type: General Level of consciousness: awake and alert Pain management: pain level controlled Vital Signs Assessment: post-procedure vital signs reviewed and stable Respiratory status: spontaneous breathing, nonlabored ventilation, respiratory function stable and patient connected to nasal cannula oxygen Cardiovascular status: blood pressure returned to baseline and stable Postop Assessment: no apparent nausea or vomiting Anesthetic complications: no   No notable events documented.   Last Vitals:  Vitals:   10/20/24 0920 10/20/24 0922  BP:  134/69  Pulse: 76 77  Resp: 14 14  Temp: (!) 35.2 C   SpO2: 99% 99%    Last Pain:  Vitals:   10/20/24 0920  TempSrc: Temporal  PainSc:                  Windell Farr

## 2024-10-20 NOTE — Anesthesia Preprocedure Evaluation (Signed)
 Anesthesia Evaluation  Patient identified by MRN, date of birth, ID band Patient awake    Reviewed: Allergy & Precautions, NPO status , Patient's Chart, lab work & pertinent test results  Airway Mallampati: II  TM Distance: >3 FB Neck ROM: full    Dental  (+) Teeth Intact   Pulmonary neg pulmonary ROS   Pulmonary exam normal breath sounds clear to auscultation       Cardiovascular Exercise Tolerance: Good hypertension, Pt. on medications Normal cardiovascular exam Rhythm:Regular Rate:Normal     Neuro/Psych negative neurological ROS  negative psych ROS   GI/Hepatic Neg liver ROS,GERD  Medicated,,Crohn s disease   Endo/Other  negative endocrine ROS    Renal/GU negative Renal ROS  negative genitourinary   Musculoskeletal  (+) Arthritis ,    Abdominal Normal abdominal exam  (+)   Peds negative pediatric ROS (+)  Hematology negative hematology ROS (+)   Anesthesia Other Findings          BMI    Body Mass Index: 30.55 kg/m      Reproductive/Obstetrics negative OB ROS                              Anesthesia Physical Anesthesia Plan  ASA: 2  Anesthesia Plan: General   Post-op Pain Management:    Induction: Intravenous  PONV Risk Score and Plan: Propofol  infusion and TIVA  Airway Management Planned: Natural Airway  Additional Equipment:   Intra-op Plan:   Post-operative Plan:   Informed Consent: I have reviewed the patients History and Physical, chart, labs and discussed the procedure including the risks, benefits and alternatives for the proposed anesthesia with the patient or authorized representative who has indicated his/her understanding and acceptance.     Dental Advisory Given  Plan Discussed with: CRNA and Surgeon  Anesthesia Plan Comments: (IVGA with no AC complications)        Anesthesia Quick Evaluation

## 2024-10-20 NOTE — Interval H&P Note (Signed)
 History and Physical Interval Note:  10/20/2024 8:53 AM  Patricia Callahan  has presented today for surgery, with the diagnosis of Colitis, nonspecific, unspecified (K52.9) Bloating (R14.0).  The various methods of treatment have been discussed with the patient and family. After consideration of risks, benefits and other options for treatment, the patient has consented to  Procedure(s): COLONOSCOPY (N/A) as a surgical intervention.  The patient's history has been reviewed, patient examined, no change in status, stable for surgery.  I have reviewed the patient's chart and labs.  Questions were answered to the patient's satisfaction.     [place], Uldine Fuster

## 2024-10-20 NOTE — Op Note (Signed)
 [facility] Patient Name: Patricia Callahan Procedure Date: 10/20/2024 8:49 AM MRN: [id number] Account #: [id number] Date of Birth: [birth year] Admit Type: Outpatient Age: 63 Room: [facility] ENDO ROOM 3 Gender: Female Note Status: Finalized Instrument Name: Colon Scope [id number] Procedure:             Colonoscopy Indications:           Ulcerative colitis Providers:             Kristi Norment K. Aundria MD, MD Referring MD:          Lynwood FALCON. Valora, MD (Referring MD) Medicines:             Propofol  per Anesthesia Complications:         No immediate complications. Estimated blood loss:                         Minimal. Procedure:             Pre-Anesthesia Assessment:                        - The risks and benefits of the procedure and the                         sedation options and risks were discussed with the                         patient. All questions were answered and informed                         consent was obtained.                        - Patient identification and proposed procedure were                         verified prior to the procedure by the nurse. The                         procedure was verified in the procedure room.                        - ASA Grade Assessment: II - A patient with mild                         systemic disease.                        - After reviewing the risks and benefits, the patient                         was deemed in satisfactory condition to undergo the                         procedure.                        After obtaining informed consent, the colonoscope was                         passed under direct vision. Throughout  the procedure,                         the patient's blood pressure, pulse, and oxygen                         saturations were monitored continuously. The                         Colonoscope was introduced through the anus and                         advanced to the the ileocolonic  anastomosis. The                         colonoscopy was somewhat difficult due to restricted                         mobility of the colon. Successful completion of the                         procedure was aided by straightening and shortening                         the scope to obtain bowel loop reduction. The patient                         tolerated the procedure well. The quality of the bowel                         preparation was good. Ileocolonic anastomosis and                         neoterminal ileum were photographed. Findings:      The perianal exam findings include internal hemorrhoids that prolapse       with straining, but require manual replacement into the anal canal       (Grade III).      Non-bleeding internal hemorrhoids were found during retroflexion. The       hemorrhoids were Grade II (internal hemorrhoids that prolapse but reduce       spontaneously).      There was evidence of a prior functional end-to-end ileo-colonic       anastomosis in the ascending colon. This was non-patent and was       characterized by stenosis 0.7 cm (inner diameter) x 11 cm (length) and       stenosis 1 cm (inner diameter) x 0.3 cm (length). The anastomosis was       traversed after dilation. A TTS dilator was passed through the scope.       Dilation with a 15 mm colonic balloon dilator was performed. The       dilation site was examined following endoscope reinsertion and showed       moderate mucosal disruption. Estimated blood loss was minimal. This was       biopsied with a cold forceps for histology. Estimated blood loss was       minimal.      The neo-terminal ileum contained a single (solitary) three mm ulcer. No       bleeding was present. No stigmata  of recent bleeding were seen. Biopsies       were taken with a cold forceps for histology. Estimated blood loss was       minimal.      Normal mucosa was found in the entire colon. Biopsies for histology were       taken with a  cold forceps from the random colon for evaluation of       microscopic colitis. Two biopsies were obtained with cold forceps for       histology in the rectum. Estimated blood loss was minimal.      The exam was otherwise without abnormality. Impression:            - Internal hemorrhoids that prolapse with straining,                         but require manual replacement into the anal canal                         (Grade III) found on perianal exam.                        - Non-bleeding internal hemorrhoids.                        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 Dilated.                         Biopsied.                        - A single (solitary) ulcer in the neo-terminal ileum.                         Biopsied.                        - Normal mucosa in the entire examined colon. Biopsied.                        - The examination was otherwise normal.                        - Biopsies performed in the rectum. Recommendation:        - Patient has a contact number available for                         emergencies. The signs and symptoms of potential                         delayed complications were discussed with the patient.                         Return to normal activities tomorrow. Written                         discharge instructions were provided to the patient.                        -  Resume previous diet.                        - Continue present medications.                        - Await pathology results.                        - Repeat colonoscopy is recommended for surveillance.                         The colonoscopy date will be determined after                         pathology results from today's exam become available                         for review.                        - Follow up with Romero Antigua, PA-C at [facility]. [phone]  [phone].                        - Telephone GI office to schedule appointment at the                         next available appointment.                        - The findings and recommendations were discussed with                         the patient. Procedure Code(s):     --- Professional ---                        [phone], Colonoscopy, flexible; with transendoscopic                         balloon dilation                        [id number], Colonoscopy, flexible; with biopsy, single or                         multiple Diagnosis Code(s):     --- Professional ---                        K51.90, Ulcerative colitis, unspecified, without                         complications                        K64.2, Third degree hemorrhoids                        K63.3, Ulcer of intestine  K91.89, Other postprocedural complications and                         disorders of digestive system CPT copyright 2022 [employer]. All rights reserved. The codes documented in this report are preliminary and upon coder review may  be revised to meet current compliance requirements. Ladell MARLA Boss MD, MD 10/20/2024 9:25:50 AM This report has been signed electronically. Number of Addenda: 0 Note Initiated On: 10/20/2024 8:49 AM Scope Withdrawal Time: 0 hours 11 minutes 46 seconds  Total Procedure Duration: 0 hours 18 minutes 11 seconds  Estimated Blood Loss:  Estimated blood loss was minimal.      [facility]

## 2024-10-21 LAB — SURGICAL PATHOLOGY

## 2024-12-17 ENCOUNTER — Ambulatory Visit: Payer: Self-pay | Admitting: Obstetrics and Gynecology
